# Patient Record
Sex: Male | Born: 1955
Health system: Southern US, Community
[De-identification: ages and names within clinical notes are randomized; demographics above are authoritative.]

## PROBLEM LIST (undated history)

## (undated) DIAGNOSIS — G2581 Restless legs syndrome: Secondary | ICD-10-CM

## (undated) DIAGNOSIS — I10 Essential (primary) hypertension: Secondary | ICD-10-CM

## (undated) DIAGNOSIS — G4730 Sleep apnea, unspecified: Secondary | ICD-10-CM

## (undated) DIAGNOSIS — F419 Anxiety disorder, unspecified: Secondary | ICD-10-CM

## (undated) DIAGNOSIS — K219 Gastro-esophageal reflux disease without esophagitis: Secondary | ICD-10-CM

## (undated) DIAGNOSIS — K5792 Diverticulitis of intestine, part unspecified, without perforation or abscess without bleeding: Secondary | ICD-10-CM

## (undated) DIAGNOSIS — K648 Other hemorrhoids: Secondary | ICD-10-CM

## (undated) DIAGNOSIS — R0683 Snoring: Secondary | ICD-10-CM

## (undated) HISTORY — DX: Other hemorrhoids: K64.8

## (undated) HISTORY — DX: Gastro-esophageal reflux disease without esophagitis: K21.9

## (undated) HISTORY — DX: Diverticulitis of intestine, part unspecified, without perforation or abscess without bleeding: K57.92

---
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
## 1998-05-04 ENCOUNTER — Emergency Department (HOSPITAL_COMMUNITY): Admission: EM | Admit: 1998-05-04 | Discharge: 1998-05-04 | Payer: Self-pay

## 2001-02-13 ENCOUNTER — Encounter: Admission: RE | Admit: 2001-02-13 | Discharge: 2001-03-18 | Payer: Self-pay | Admitting: Orthopedic Surgery

## 2002-06-03 ENCOUNTER — Inpatient Hospital Stay (HOSPITAL_COMMUNITY): Admission: EM | Admit: 2002-06-03 | Discharge: 2002-06-06 | Payer: Self-pay | Admitting: Psychiatry

## 2002-10-13 ENCOUNTER — Encounter: Payer: Self-pay | Admitting: General Surgery

## 2002-10-15 ENCOUNTER — Ambulatory Visit (HOSPITAL_COMMUNITY): Admission: RE | Admit: 2002-10-15 | Discharge: 2002-10-15 | Payer: Self-pay | Admitting: General Surgery

## 2002-10-19 ENCOUNTER — Encounter (INDEPENDENT_AMBULATORY_CARE_PROVIDER_SITE_OTHER): Payer: Self-pay

## 2002-10-19 ENCOUNTER — Ambulatory Visit (HOSPITAL_COMMUNITY): Admission: RE | Admit: 2002-10-19 | Discharge: 2002-10-19 | Payer: Self-pay | Admitting: General Surgery

## 2005-07-25 ENCOUNTER — Emergency Department (HOSPITAL_COMMUNITY): Admission: EM | Admit: 2005-07-25 | Discharge: 2005-07-25 | Payer: Self-pay | Admitting: Emergency Medicine

## 2008-02-22 ENCOUNTER — Ambulatory Visit: Payer: Self-pay | Admitting: Family Medicine

## 2008-02-22 DIAGNOSIS — Z860100 Personal history of colon polyps, unspecified: Secondary | ICD-10-CM | POA: Insufficient documentation

## 2008-02-22 DIAGNOSIS — K219 Gastro-esophageal reflux disease without esophagitis: Secondary | ICD-10-CM

## 2008-02-23 ENCOUNTER — Ambulatory Visit: Payer: Self-pay | Admitting: Family Medicine

## 2008-02-23 LAB — CONVERTED CEMR LAB
Glucose, Urine, Semiquant: NEGATIVE
Nitrite: NEGATIVE
Protein, U semiquant: NEGATIVE
Specific Gravity, Urine: 1.025
Urobilinogen, UA: 0.2
WBC Urine, dipstick: NEGATIVE
pH: 5

## 2008-02-24 ENCOUNTER — Ambulatory Visit (HOSPITAL_COMMUNITY): Admission: RE | Admit: 2008-02-24 | Discharge: 2008-02-24 | Payer: Self-pay | Admitting: Family Medicine

## 2008-02-24 LAB — CONVERTED CEMR LAB
ALT: 33 units/L (ref 0–53)
AST: 28 units/L (ref 0–37)
Albumin: 3.8 g/dL (ref 3.5–5.2)
Alkaline Phosphatase: 62 units/L (ref 39–117)
BUN: 20 mg/dL (ref 6–23)
Basophils Absolute: 0 10*3/uL (ref 0.0–0.1)
Basophils Relative: 0.7 % (ref 0.0–1.0)
Bilirubin, Direct: 0.1 mg/dL (ref 0.0–0.3)
CO2: 28 meq/L (ref 19–32)
Calcium: 9.5 mg/dL (ref 8.4–10.5)
Chloride: 102 meq/L (ref 96–112)
Cholesterol: 254 mg/dL (ref 0–200)
Creatinine, Ser: 1.1 mg/dL (ref 0.4–1.5)
Direct LDL: 174.1 mg/dL
Eosinophils Absolute: 0.3 10*3/uL (ref 0.0–0.7)
Eosinophils Relative: 4.9 % (ref 0.0–5.0)
GFR calc Af Amer: 91 mL/min
GFR calc non Af Amer: 75 mL/min
Glucose, Bld: 99 mg/dL (ref 70–99)
HCT: 44.8 % (ref 39.0–52.0)
HDL: 44.2 mg/dL (ref >39.0)
Hemoglobin: 15.7 g/dL (ref 13.0–17.0)
Lymphocytes Relative: 32.3 % (ref 12.0–46.0)
MCHC: 35 g/dL (ref 30.0–36.0)
MCV: 92 fL (ref 78.0–100.0)
Monocytes Absolute: 0.5 10*3/uL (ref 0.1–1.0)
Monocytes Relative: 7.9 % (ref 3.0–12.0)
Neutro Abs: 3.7 10*3/uL (ref 1.4–7.7)
Neutrophils Relative %: 54.2 % (ref 43.0–77.0)
PSA: 0.67 ng/mL (ref 0.10–4.00)
Platelets: 244 10*3/uL (ref 150–400)
Potassium: 5.4 meq/L — ABNORMAL HIGH (ref 3.5–5.1)
RBC: 4.86 M/uL (ref 4.22–5.81)
RDW: 11.8 % (ref 11.5–14.6)
Sodium: 138 meq/L (ref 135–145)
TSH: 2.74 microintl units/mL (ref 0.35–5.50)
Total Bilirubin: 0.8 mg/dL (ref 0.3–1.2)
Total CHOL/HDL Ratio: 5.7
Total Protein: 7.3 g/dL (ref 6.0–8.3)
Triglycerides: 201 mg/dL (ref 0–149)
VLDL: 40 mg/dL (ref 0–40)
WBC: 6.6 10*3/uL (ref 4.5–10.5)

## 2008-03-14 ENCOUNTER — Ambulatory Visit: Payer: Self-pay | Admitting: Gastroenterology

## 2008-03-14 DIAGNOSIS — R1310 Dysphagia, unspecified: Secondary | ICD-10-CM | POA: Insufficient documentation

## 2008-03-14 DIAGNOSIS — K625 Hemorrhage of anus and rectum: Secondary | ICD-10-CM

## 2008-03-22 ENCOUNTER — Encounter: Payer: Self-pay | Admitting: Family Medicine

## 2008-03-22 ENCOUNTER — Encounter: Payer: Self-pay | Admitting: Gastroenterology

## 2008-03-22 ENCOUNTER — Ambulatory Visit: Payer: Self-pay | Admitting: Gastroenterology

## 2008-03-22 ENCOUNTER — Ambulatory Visit (HOSPITAL_COMMUNITY): Admission: RE | Admit: 2008-03-22 | Discharge: 2008-03-22 | Payer: Self-pay | Admitting: Gastroenterology

## 2008-03-23 ENCOUNTER — Encounter: Payer: Self-pay | Admitting: Gastroenterology

## 2008-03-23 ENCOUNTER — Telehealth: Payer: Self-pay | Admitting: Gastroenterology

## 2008-03-23 HISTORY — PX: COLONOSCOPY: SHX174

## 2008-03-24 ENCOUNTER — Encounter: Payer: Self-pay | Admitting: Gastroenterology

## 2008-03-28 ENCOUNTER — Ambulatory Visit: Payer: Self-pay | Admitting: Gastroenterology

## 2008-04-18 ENCOUNTER — Telehealth: Payer: Self-pay | Admitting: Gastroenterology

## 2008-04-21 ENCOUNTER — Telehealth: Payer: Self-pay | Admitting: Gastroenterology

## 2008-04-21 ENCOUNTER — Ambulatory Visit: Payer: Self-pay | Admitting: Gastroenterology

## 2008-04-21 HISTORY — PX: ESOPHAGEAL DILATION: SHX303

## 2008-04-26 ENCOUNTER — Encounter: Payer: Self-pay | Admitting: Gastroenterology

## 2008-04-26 DIAGNOSIS — K649 Unspecified hemorrhoids: Secondary | ICD-10-CM | POA: Insufficient documentation

## 2008-05-18 ENCOUNTER — Ambulatory Visit: Payer: Self-pay | Admitting: Gastroenterology

## 2008-05-18 ENCOUNTER — Ambulatory Visit (HOSPITAL_COMMUNITY): Admission: RE | Admit: 2008-05-18 | Discharge: 2008-05-18 | Payer: Self-pay | Admitting: Gastroenterology

## 2008-05-18 ENCOUNTER — Encounter: Payer: Self-pay | Admitting: Family Medicine

## 2008-05-20 ENCOUNTER — Telehealth: Payer: Self-pay | Admitting: Gastroenterology

## 2008-05-23 ENCOUNTER — Telehealth: Payer: Self-pay | Admitting: Gastroenterology

## 2008-05-25 ENCOUNTER — Encounter: Payer: Self-pay | Admitting: Gastroenterology

## 2008-05-25 HISTORY — PX: OTHER SURGICAL HISTORY: SHX169

## 2008-06-29 ENCOUNTER — Ambulatory Visit: Payer: Self-pay | Admitting: Family Medicine

## 2008-06-29 DIAGNOSIS — M79609 Pain in unspecified limb: Secondary | ICD-10-CM

## 2008-06-30 ENCOUNTER — Telehealth: Payer: Self-pay | Admitting: Family Medicine

## 2008-07-07 ENCOUNTER — Telehealth: Payer: Self-pay | Admitting: Family Medicine

## 2008-07-08 ENCOUNTER — Telehealth: Payer: Self-pay | Admitting: Family Medicine

## 2008-08-02 ENCOUNTER — Ambulatory Visit: Payer: Self-pay | Admitting: Family Medicine

## 2008-08-02 DIAGNOSIS — R1031 Right lower quadrant pain: Secondary | ICD-10-CM | POA: Insufficient documentation

## 2008-08-02 LAB — CONVERTED CEMR LAB
Nitrite: NEGATIVE
Specific Gravity, Urine: >=1.03

## 2008-08-03 ENCOUNTER — Ambulatory Visit: Payer: Self-pay | Admitting: Cardiology

## 2008-08-03 LAB — CONVERTED CEMR LAB
Albumin: 3.9 g/dL (ref 3.5–5.2)
BUN: 18 mg/dL (ref 6–23)
Calcium: 9.3 mg/dL (ref 8.4–10.5)
Eosinophils Absolute: 0.4 10*3/uL (ref 0.0–0.7)
Eosinophils Relative: 3.9 % (ref 0.0–5.0)
GFR calc Af Amer: 114 mL/min
Glucose, Bld: 81 mg/dL (ref 70–99)
HCT: 44.9 % (ref 39.0–52.0)
Hemoglobin: 15.5 g/dL (ref 13.0–17.0)
MCV: 93 fL (ref 78.0–100.0)
Monocytes Absolute: 0.4 10*3/uL (ref 0.1–1.0)
Monocytes Relative: 4.4 % (ref 3.0–12.0)
Neutro Abs: 6 10*3/uL (ref 1.4–7.7)
RBC: 4.83 M/uL (ref 4.22–5.81)
RDW: 12.7 % (ref 11.5–14.6)
Total Protein: 7.3 g/dL (ref 6.0–8.3)

## 2009-10-17 ENCOUNTER — Ambulatory Visit: Payer: Self-pay | Admitting: Family Medicine

## 2009-10-17 DIAGNOSIS — J019 Acute sinusitis, unspecified: Secondary | ICD-10-CM

## 2009-11-28 ENCOUNTER — Ambulatory Visit: Payer: Self-pay | Admitting: Family Medicine

## 2010-04-12 ENCOUNTER — Telehealth: Payer: Self-pay | Admitting: Family Medicine

## 2010-08-28 ENCOUNTER — Telehealth: Payer: Self-pay | Admitting: Family Medicine

## 2010-08-29 ENCOUNTER — Ambulatory Visit
Admission: RE | Admit: 2010-08-29 | Discharge: 2010-08-29 | Payer: Self-pay | Source: Home / Self Care | Attending: Family Medicine | Admitting: Family Medicine

## 2010-09-16 LAB — CONVERTED CEMR LAB
AST: 28 units/L (ref 0–37)
BUN: 10 mg/dL (ref 6–23)
Basophils Absolute: 0 10*3/uL (ref 0.0–0.1)
Calcium: 9.2 mg/dL (ref 8.4–10.5)
Cholesterol: 244 mg/dL — ABNORMAL HIGH (ref 0–200)
Creatinine, Ser: 0.9 mg/dL (ref 0.4–1.5)
GFR calc non Af Amer: 93.71 mL/min (ref >60)
Glucose, Bld: 79 mg/dL (ref 70–99)
HCT: 44.2 % (ref 39.0–52.0)
HDL: 51.7 mg/dL (ref >39.00)
Lymphs Abs: 2.8 10*3/uL (ref 0.7–4.0)
Monocytes Relative: 7 % (ref 3.0–12.0)
PSA: 2.28 ng/mL (ref 0.10–4.00)
Platelets: 265 10*3/uL (ref 150.0–400.0)
RDW: 13.5 % (ref 11.5–14.6)
TSH: 3.32 microintl units/mL (ref 0.35–5.50)
Total Bilirubin: 0.3 mg/dL (ref 0.3–1.2)
Triglycerides: 221 mg/dL — ABNORMAL HIGH (ref 0.0–149.0)

## 2010-09-18 NOTE — Progress Notes (Signed)
Summary: refill cough syrup  Phone Note Refill Request Message from:  Fax from Pharmacy on April 12, 2010 8:23 AM  Refills Requested: Medication #1:  hydrocodone-chlorpheniram susp   Last Refilled: 12/29/2009   Notes: 5ml bid prn cough   Method Requested: Fax to Local Pharmacy [facility]er: [facility] [phone]*  Follow-up for Phone Call        No, he needs an OV for this Follow-up by: Nelwyn Salisbury MD,  April 12, 2010 9:54 AM  Additional Follow-up for Phone Call Additional follow up Details #1::        Rx denial called/faxed to pharmacy Additional Follow-up by: Raechel Ache, RN,  April 12, 2010 10:16 AM
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
## 2010-09-18 NOTE — Assessment & Plan Note (Signed)
Summary: sinuses//ccm   Vital Signs:  Patient profile:   55 year old male Weight:      229 pounds BMI:     32.51 Temp:     98.6 degrees F oral Pulse rate:   104 / minute BP sitting:   112 / 74  (left arm) Cuff size:   large  Vitals Entered By: Alfred Levins, CMA (October 17, 2009 10:02 AM) CC: laryngitis, cough, chest congestion x3 wks   History of Present Illness: Here for 3 weeks of stuffy head, HA, ST, hoarseness, and a dry cough. No fever.   Current Medications (verified): 1)  Aspirin 81 Mg  Tbec (Aspirin) .... Take 1 Tablet By Mouth Once A Day 2)  Nexium 40 Mg  Cpdr (Esomeprazole Magnesium) .... Take 1 Tablet By Mouth Once A Day 3)  Vicodin 5-500 Mg Tabs (Hydrocodone-Acetaminophen) .... One By Mouth Q 6 Hours As Needed Pain  Allergies (verified): No Known Drug Allergies  Past History:  Past Medical History: Reviewed history from 06/29/2008 and no changes required. Colonic polyps, hx of GERD rectal bleeding internal hemorrhoids, sees Dr. Arlyce Dice  Review of Systems  The patient denies anorexia, fever, weight loss, weight gain, vision loss, decreased hearing, hoarseness, chest pain, syncope, dyspnea on exertion, peripheral edema, hemoptysis, abdominal pain, melena, hematochezia, severe indigestion/heartburn, hematuria, incontinence, genital sores, muscle weakness, suspicious skin lesions, transient blindness, difficulty walking, depression, unusual weight change, abnormal bleeding, enlarged lymph nodes, angioedema, breast masses, and testicular masses.    Physical Exam  General:  Well-developed,well-nourished,in no acute distress; alert,appropriate and cooperative throughout examination Head:  Normocephalic and atraumatic without obvious abnormalities. No apparent alopecia or balding. Eyes:  No corneal or conjunctival inflammation noted. EOMI. Perrla. Funduscopic exam benign, without hemorrhages, exudates or papilledema. Vision grossly normal. Ears:  External ear exam  shows no significant lesions or deformities.  Otoscopic examination reveals clear canals, tympanic membranes are intact bilaterally without bulging, retraction, inflammation or discharge. Hearing is grossly normal bilaterally. Nose:  External nasal examination shows no deformity or inflammation. Nasal mucosa are pink and moist without lesions or exudates. Mouth:  Oral mucosa and oropharynx without lesions or exudates.  Teeth in good repair. Neck:  No deformities, masses, or tenderness noted. Lungs:  Normal respiratory effort, chest expands symmetrically. Lungs are clear to auscultation, no crackles or wheezes.   Impression & Recommendations:  Problem # 1:  ACUTE SINUSITIS, UNSPECIFIED (ICD-461.9)  His updated medication list for this problem includes:    Augmentin 875-125 Mg Tabs (Amoxicillin-pot clavulanate) .Marland Kitchen..Marland Kitchen Two times a day    Tussionex Pennkinetic Er 8-10 Mg/34ml Lqcr (Chlorpheniramine-hydrocodone) .Marland Kitchen... 1 tsp two times a day as needed cough  Complete Medication List: 1)  Aspirin 81 Mg Tbec (Aspirin) .... Take 1 tablet by mouth once a day 2)  Nexium 40 Mg Cpdr (Esomeprazole magnesium) .... Take 1 tablet by mouth once a day 3)  Vicodin 5-500 Mg Tabs (Hydrocodone-acetaminophen) .... One by mouth q 6 hours as needed pain 4)  Augmentin 875-125 Mg Tabs (Amoxicillin-pot clavulanate) .... Two times a day 5)  Tussionex Pennkinetic Er 8-10 Mg/86ml Lqcr (Chlorpheniramine-hydrocodone) .Marland Kitchen.. 1 tsp two times a day as needed cough  Patient Instructions: 1)  Please schedule a follow-up appointment as needed .  Prescriptions: Sandria Senter ER 8-10 MG/5ML LQCR (CHLORPHENIRAMINE-HYDROCODONE) 1 tsp two times a day as needed cough  #240 x 0   Entered and Authorized by:   Nelwyn Salisbury MD   Signed by:   Nelwyn Salisbury MD  on 10/17/2009   Method used:   Print then Give to Patient   RxID:   [id number] AUGMENTIN 875-125 MG TABS (AMOXICILLIN-POT CLAVULANATE) two times a day  #20 x 0   Entered  and Authorized by:   Nelwyn Salisbury MD   Signed by:   Nelwyn Salisbury MD on 10/17/2009   Method used:   Print then Give to Patient   RxID:   [id number]

## 2010-09-20 NOTE — Progress Notes (Signed)
  Phone Note Call from Patient   Caller: Patient Call For: Nelwyn Salisbury MD Summary of Call: Chills, URI, cough, (dry), sore throat, and wants an antibiotic, and cough meds.  Works in [employer], and says he has to do something today? [phone] [facility]) Initial call taken by: Lynann Beaver CMA AAMA,  August 28, 2010 8:24 AM  Follow-up for Phone Call        needs an OV  Follow-up by: Nelwyn Salisbury MD,  August 28, 2010 1:53 PM  Additional Follow-up for Phone Call Additional follow up Details #1::        Appt scheduled. Additional Follow-up by: Lynann Beaver CMA AAMA,  August 28, 2010 2:40 PM

## 2010-09-20 NOTE — Assessment & Plan Note (Signed)
Summary: viral illness/dm   Vital Signs:  Patient profile:   55 year old male Height:      70.5 inches Weight:      221 pounds BMI:     31.38 Temp:     98.3 degrees F oral BP sitting:   140 / 98  (left arm) Cuff size:   regular  Vitals Entered By: Kern Reap CMA Duncan Dull) (August 29, 2010 10:06 AM) CC: cold and congestion   History of Present Illness: Here for one week of sinus pressure, PND, ST, and a dry cough. No fever.   Allergies (verified): No Known Drug Allergies  Past History:  Past Medical History: Reviewed history from 11/28/2009 and no changes required. Colonic polyps, hx of GERD rectal bleeding internal hemorrhoids, sees Dr. Arlyce Dice diverticulitis  Review of Systems  The patient denies anorexia, fever, weight loss, weight gain, vision loss, decreased hearing, hoarseness, chest pain, syncope, dyspnea on exertion, peripheral edema, hemoptysis, abdominal pain, melena, hematochezia, severe indigestion/heartburn, hematuria, incontinence, genital sores, muscle weakness, suspicious skin lesions, transient blindness, difficulty walking, depression, unusual weight change, abnormal bleeding, enlarged lymph nodes, angioedema, breast masses, and testicular masses.    Physical Exam  General:  Well-developed,well-nourished,in no acute distress; alert,appropriate and cooperative throughout examination Head:  Normocephalic and atraumatic without obvious abnormalities. No apparent alopecia or balding. Eyes:  No corneal or conjunctival inflammation noted. EOMI. Perrla. Funduscopic exam benign, without hemorrhages, exudates or papilledema. Vision grossly normal. Ears:  External ear exam shows no significant lesions or deformities.  Otoscopic examination reveals clear canals, tympanic membranes are intact bilaterally without bulging, retraction, inflammation or discharge. Hearing is grossly normal bilaterally. Nose:  External nasal examination shows no deformity or inflammation.  Nasal mucosa are pink and moist without lesions or exudates. Mouth:  Oral mucosa and oropharynx without lesions or exudates.  Teeth in good repair. Neck:  No deformities, masses, or tenderness noted. Lungs:  Normal respiratory effort, chest expands symmetrically. Lungs are clear to auscultation, no crackles or wheezes.   Impression & Recommendations:  Problem # 1:  ACUTE SINUSITIS, UNSPECIFIED (ICD-461.9)  His updated medication list for this problem includes:    Augmentin 875-125 Mg Tabs (Amoxicillin-pot clavulanate) .Marland Kitchen..Marland Kitchen Two times a day    Hydromet 5-1.5 Mg/64ml Syrp (Hydrocodone-homatropine) .Marland Kitchen... 1 tsp q 4 hours as needed cough  Complete Medication List: 1)  Nexium 40 Mg Cpdr (Esomeprazole magnesium) .Marland Kitchen.. 1 once daily 2)  Augmentin 875-125 Mg Tabs (Amoxicillin-pot clavulanate) .... Two times a day 3)  Hydromet 5-1.5 Mg/42ml Syrp (Hydrocodone-homatropine) .Marland Kitchen.. 1 tsp q 4 hours as needed cough  Patient Instructions: 1)  Please schedule a follow-up appointment as needed .  Prescriptions: HYDROMET 5-1.5 MG/5ML SYRP (HYDROCODONE-HOMATROPINE) 1 tsp q 4 hours as needed cough  #240 x 0   Entered and Authorized by:   Nelwyn Salisbury MD   Signed by:   Nelwyn Salisbury MD on 08/29/2010   Method used:   Print then Give to Patient   RxID:   [id number] AUGMENTIN 875-125 MG TABS (AMOXICILLIN-POT CLAVULANATE) two times a day  #28 x 0   Entered and Authorized by:   Nelwyn Salisbury MD   Signed by:   Nelwyn Salisbury MD on 08/29/2010   Method used:   Print then Give to Patient   RxID:   [id number]    Orders Added: 1)  Est. Patient Level IV [95284]

## 2010-09-24 IMAGING — CR DG FOOT COMPLETE 3+V*R*
3 series · 3 of 3 positions shown · non-contrast
Comparison: None

CLINICAL DATA: Right anterior foot pain, chamber fell while walking
in sleep 2 weeks ago

RIGHT FOOT COMPLETE - 3+ VIEW

[view not recorded (1 of 3)]
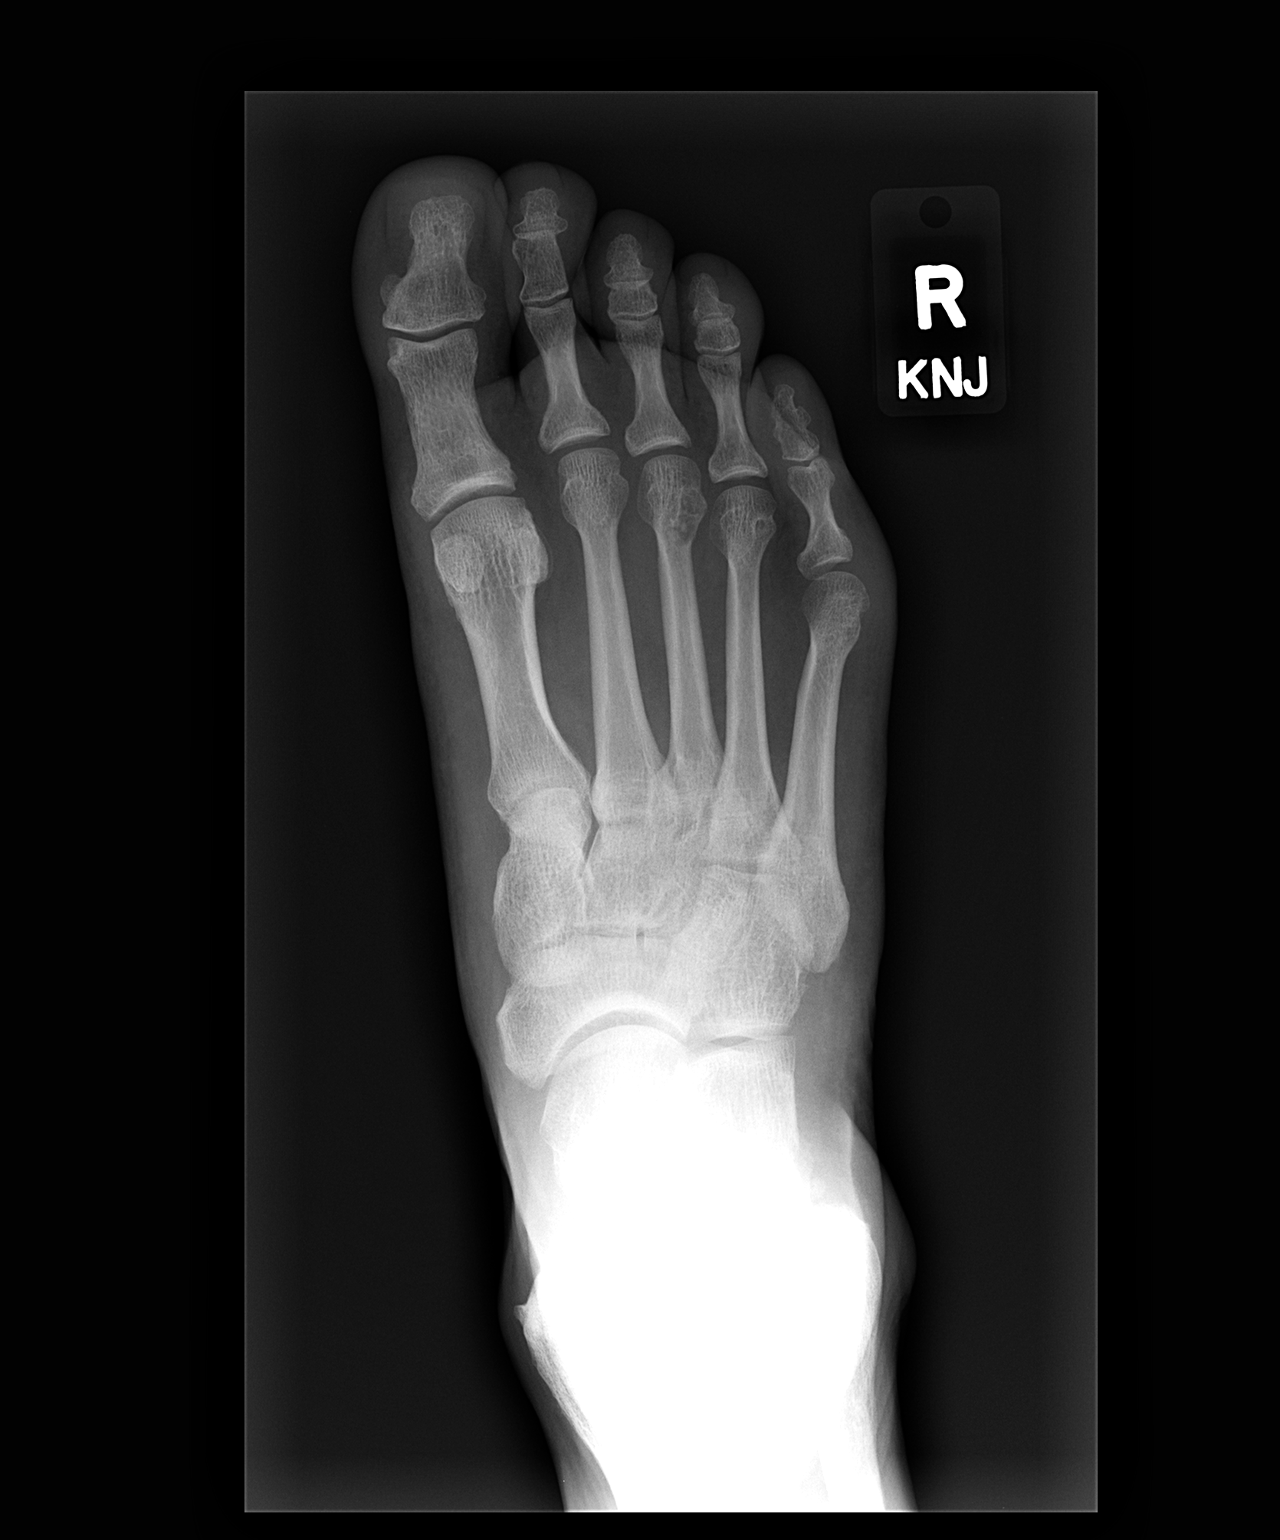

[view not recorded (2 of 3)]
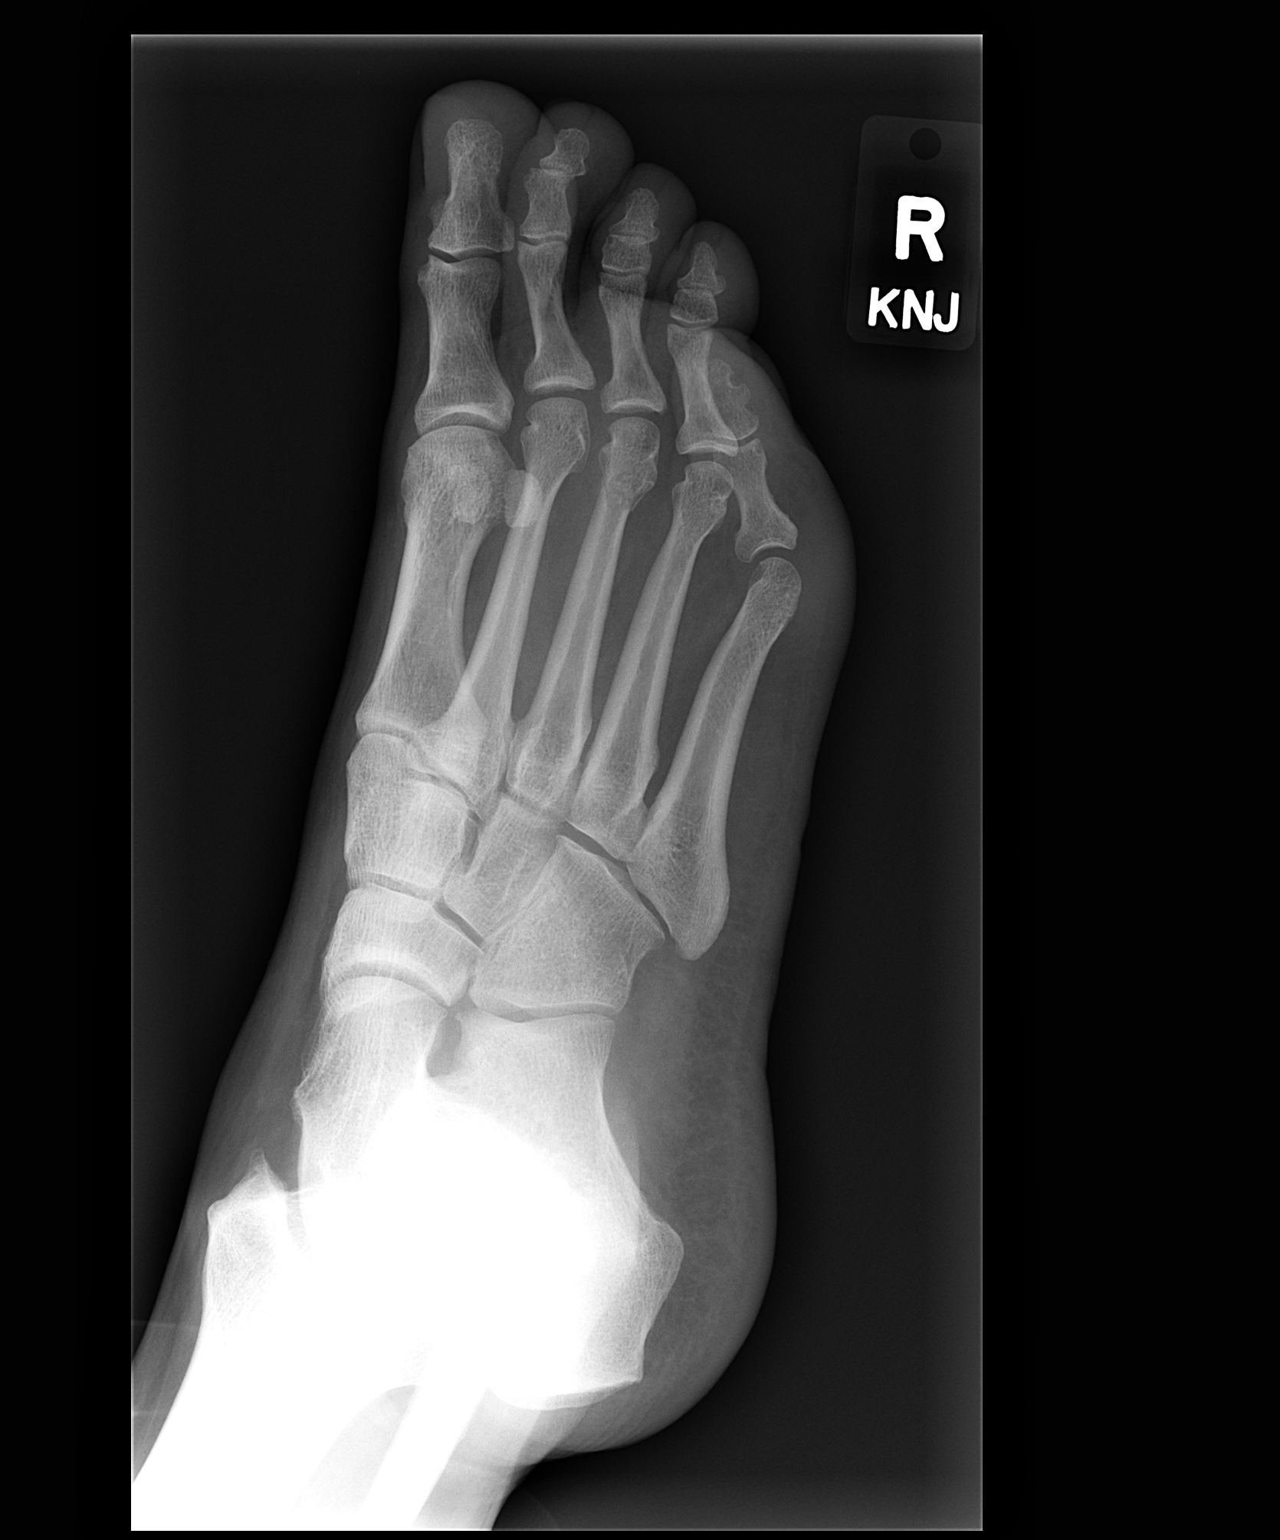

[view not recorded (3 of 3)]
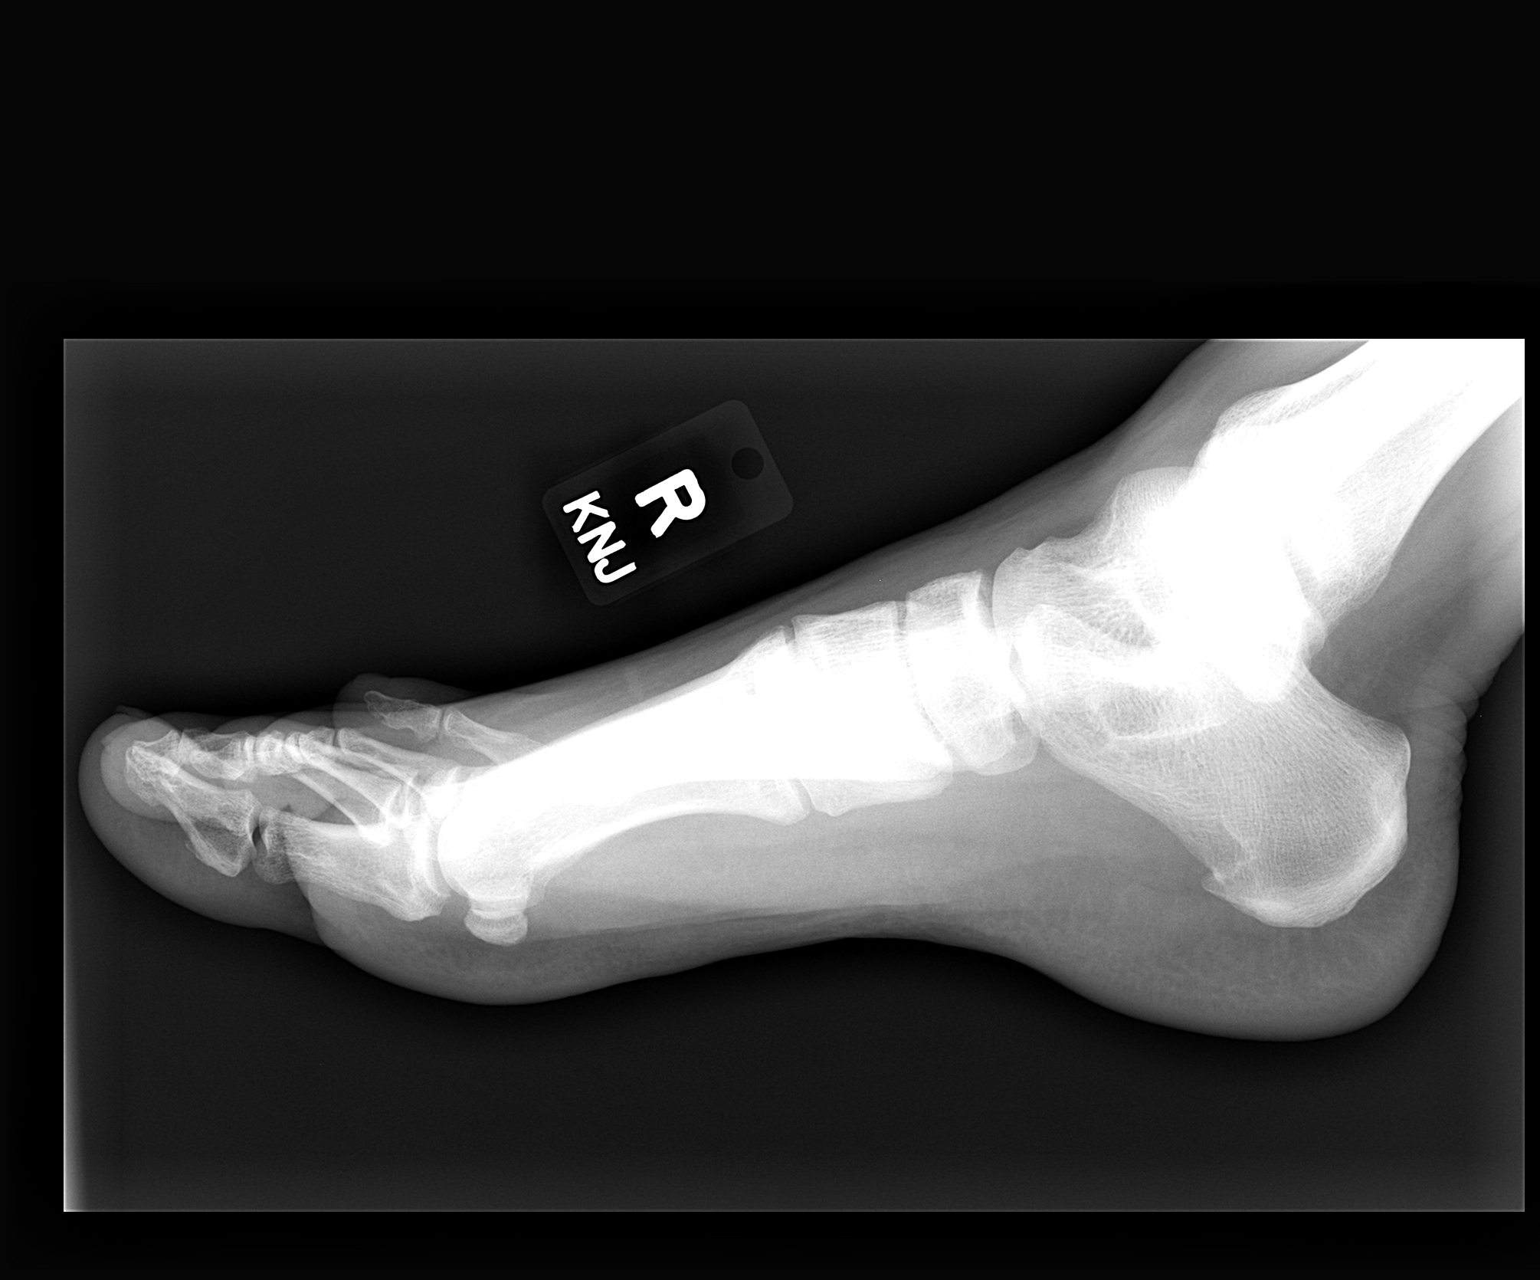

[3 of 3 positions shown; findings below may reference images not displayed]

FINDINGS: No acute fracture is seen.  Alignment is normal.  Some
lucency is noted in the neck of the right third metatarsal which
appears well corticated and consistent with a benign process.
IMPRESSION: No acute bony abnormality.

## 2010-10-18 ENCOUNTER — Other Ambulatory Visit: Payer: Self-pay | Admitting: Family Medicine

## 2010-10-25 ENCOUNTER — Telehealth: Payer: Self-pay | Admitting: *Deleted

## 2010-10-25 NOTE — Telephone Encounter (Signed)
patient  Is requesting a refill of hydrocodone cough syrup.  [facility]

## 2010-10-26 NOTE — Telephone Encounter (Signed)
rx called in

## 2010-10-26 NOTE — Telephone Encounter (Signed)
Call in Hydromet, take one tsp q 4 hours prn cough, 240 ml with no rf

## 2010-11-01 ENCOUNTER — Other Ambulatory Visit: Payer: Self-pay | Admitting: *Deleted

## 2010-11-01 NOTE — Telephone Encounter (Signed)
Patient is requesting a refill of hydrocodone cough syrup is this okay to fill?

## 2010-11-06 ENCOUNTER — Encounter: Payer: Self-pay | Admitting: Family Medicine

## 2010-11-08 MED ORDER — HYDROCODONE-HOMATROPINE 5-1.5 MG/5ML PO SYRP: 5.0000 mL | ORAL_SOLUTION | ORAL | Status: AC | PRN

## 2010-11-08 NOTE — Telephone Encounter (Signed)
Called into [facility] 

## 2010-11-08 NOTE — Telephone Encounter (Signed)
Okay to fill another 240 ml bottle

## 2010-11-30 ENCOUNTER — Encounter: Payer: Self-pay | Admitting: Family Medicine

## 2010-12-11 ENCOUNTER — Encounter: Payer: Self-pay | Admitting: Family Medicine

## 2010-12-12 ENCOUNTER — Ambulatory Visit (INDEPENDENT_AMBULATORY_CARE_PROVIDER_SITE_OTHER): Payer: PRIVATE HEALTH INSURANCE | Admitting: Family Medicine

## 2010-12-12 ENCOUNTER — Encounter: Payer: Self-pay | Admitting: Family Medicine

## 2010-12-12 VITALS — BP 112/84 | HR 92 | Temp 98.0°F | Ht 70.5 in | Wt 231.0 lb

## 2010-12-12 DIAGNOSIS — Z0000 Encounter for general adult medical examination without abnormal findings: Secondary | ICD-10-CM

## 2010-12-12 DIAGNOSIS — Z136 Encounter for screening for cardiovascular disorders: Secondary | ICD-10-CM

## 2010-12-12 DIAGNOSIS — Z13220 Encounter for screening for lipoid disorders: Secondary | ICD-10-CM

## 2010-12-12 LAB — POCT URINALYSIS DIPSTICK
Blood, UA: NEGATIVE
Glucose, UA: NEGATIVE
Spec Grav, UA: 1.02
Urobilinogen, UA: 0.2
pH, UA: 5.5

## 2010-12-12 LAB — LDL CHOLESTEROL, DIRECT: Direct LDL: 174.7 mg/dL

## 2010-12-12 LAB — CBC WITH DIFFERENTIAL/PLATELET
Basophils Relative: 0.4 % (ref 0.0–3.0)
Eosinophils Absolute: 0.3 10*3/uL (ref 0.0–0.7)
Eosinophils Relative: 5 % (ref 0.0–5.0)
HCT: 46.2 % (ref 39.0–52.0)
Lymphs Abs: 2.9 10*3/uL (ref 0.7–4.0)
MCHC: 34.4 g/dL (ref 30.0–36.0)
MCV: 93.5 fl (ref 78.0–100.0)
Monocytes Absolute: 0.5 10*3/uL (ref 0.1–1.0)
Platelets: 241 10*3/uL (ref 150.0–400.0)
WBC: 6.6 10*3/uL (ref 4.5–10.5)

## 2010-12-12 LAB — LIPID PANEL
Cholesterol: 255 mg/dL — ABNORMAL HIGH (ref 0–200)
HDL: 50 mg/dL (ref >39.00)
Triglycerides: 184 mg/dL — ABNORMAL HIGH (ref 0.0–149.0)

## 2010-12-12 LAB — BASIC METABOLIC PANEL
BUN: 18 mg/dL (ref 6–23)
CO2: 30 mEq/L (ref 19–32)
Calcium: 9.7 mg/dL (ref 8.4–10.5)
Creatinine, Ser: 0.9 mg/dL (ref 0.4–1.5)
GFR: 98.38 mL/min (ref >60.00)
Glucose, Bld: 90 mg/dL (ref 70–99)

## 2010-12-12 LAB — HEPATIC FUNCTION PANEL
ALT: 27 U/L (ref 0–53)
AST: 25 U/L (ref 0–37)
Alkaline Phosphatase: 60 U/L (ref 39–117)
Bilirubin, Direct: 0 mg/dL (ref 0.0–0.3)
Total Protein: 7.5 g/dL (ref 6.0–8.3)

## 2010-12-12 MED ORDER — SILDENAFIL CITRATE 100 MG PO TABS: 100.0000 mg | ORAL_TABLET | ORAL | Status: DC | PRN

## 2010-12-12 MED ORDER — ALPRAZOLAM 1 MG PO TABS: 1.0000 mg | ORAL_TABLET | Freq: Three times a day (TID) | ORAL | Status: DC | PRN

## 2010-12-12 NOTE — Progress Notes (Signed)
  Subjective:    Patient ID: Dwayne Woodard, male    DOB: 1955-11-05, 55 y.o.   MRN: [id number]  HPI 55 yr old male for a cpx. He has several issues to discuss. First he asks for something to help him relax. 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 He can't relax at home and has trouble sleeping. This has resulted in him drinking a lot more beer every day than he used to. He has gained weight and feels tired al the time. He has had some erection problems. No GERD symptoms at all.    Review of Systems  Constitutional: Negative.   HENT: Negative.   Eyes: Negative.   Respiratory: Negative.   Cardiovascular: Negative.   Gastrointestinal: Negative.   Genitourinary: Negative.   Musculoskeletal: Negative.   Skin: Negative.   Neurological: Negative.   Hematological: Negative.   Psychiatric/Behavioral: Positive for sleep disturbance and decreased concentration. Negative for suicidal ideas, hallucinations, behavioral problems, confusion, self-injury, dysphoric mood and agitation. The patient is nervous/anxious. The patient is not hyperactive.        Objective:   Physical Exam  Constitutional: He is oriented to person, place, and time. He appears well-developed and well-nourished. No distress.  HENT:  Head: Normocephalic and atraumatic.  Right Ear: External ear normal.  Left Ear: External ear normal.  Nose: Nose normal.  Mouth/Throat: Oropharynx is clear and moist. No oropharyngeal exudate.  Eyes: Conjunctivae and EOM are normal. Pupils are equal, round, and reactive to light. Right eye exhibits no discharge. Left eye exhibits no discharge. No scleral icterus.  Neck: Neck supple. No JVD present. No tracheal deviation present. No thyromegaly present.  Cardiovascular: Normal rate, regular rhythm, normal heart sounds and intact distal pulses.  Exam reveals no gallop and no friction rub.   No murmur heard.      EKG normal     Pulmonary/Chest: Effort normal and breath sounds normal. No respiratory distress. He has no wheezes. He has no rales. He exhibits no tenderness.  Abdominal: Soft. Bowel sounds are normal. He exhibits no distension and no mass. There is no tenderness. There is no rebound and no guarding.  Genitourinary: Rectum normal, prostate normal and penis normal. Guaiac negative stool. No penile tenderness.  Musculoskeletal: Normal range of motion. He exhibits no edema and no tenderness.  Lymphadenopathy:    He has no cervical adenopathy.  Neurological: He is alert and oriented to person, place, and time. He has normal reflexes. No cranial nerve deficit. He exhibits normal muscle tone. Coordination normal.  Skin: Skin is warm and dry. No rash noted. He is not diaphoretic. No erythema. No pallor.  Psychiatric: He has a normal mood and affect. His behavior is normal. Judgment and thought content normal.          Assessment & Plan:  He needs to exercise and lose weight. He needs to quit smoking. He needs to decrease his alcohol use. Try Xanax prn . Get labs today. Try Viagra.

## 2010-12-13 NOTE — Progress Notes (Signed)
Informed wife-will let Pt know.
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
## 2011-01-04 NOTE — Op Note (Signed)
NAME:  Dwayne Woodard, Dwayne Woodard                        ACCOUNT NO.:  [id number]   MEDICAL RECORD NO.:  [id number]                   PATIENT TYPE:  AMB   LOCATION:  DAY                                  FACILITY:  [facility]   PHYSICIAN:  Timothy E. Earlene Plater, M.D.              DATE OF BIRTH:  26-May-1956   DATE OF PROCEDURE:  10/19/2002  DATE OF DISCHARGE:                                 OPERATIVE REPORT   PREOPERATIVE DIAGNOSIS:  Internal hemorrhoids with prolapse.   POSTOPERATIVE DIAGNOSIS:  1. Internal hemorrhoids with prolapse.  2. PPH anopexy.   SURGEON:  Timothy E. Earlene Plater, M.D.   ANESTHESIA:  General.   INDICATIONS:  The patient is 49 and considers himself otherwise healthy.  He  has frequent stools, often straining, and does work at a strenuous job.  He  has failed conservative management.  After careful discussion, he wishes to  proceed with a PPH anopexy.  This has been carefully explained as well as  options, potential complications and problems.   He was prepared at home, evaluated by anesthesia, identified and the permit  signed.   DESCRIPTION OF PROCEDURE:  He was taken to the operating room, placed supine  and LMA anesthesia provided.  He was placed in steep lithotomy position.  The perianal area was prepped and draped in the usual fashion.  The anus was  inspected.  Anoscopy was carried out and the intrarectal vault cleansed and  painted with Betadine.  The anus was injected round and about with 10 mL of  Marcaine, 0.25% with epinephrine mixed 9:1 with Wydase and an additional 10%  for a wide field block anesthesia.  The anus was then dilated and the  Ethicon introducer placed.  The rectal mucosa was identified and the  distance from the dentate line mark and then a pursestring suture of 2-0  Prolene was placed approximately 4.5 to 5 cm from the dentate line.  The  Ethicon [facility] stapler was then introduced wide open through the suture line  which was then tied about the head of  the staple device and then the staple  device was carefully closed and fired and removed.  A large ream of tissue  was in the staple device and was thought to be satisfactory.  The  intrarectal area was then carefully examined.  Three sutures of 3-0 Vicryl  were placed at the posterior, right posterior and anterior positions for  minor bleeding.  This area was inspected for a total of 10 minutes and no  bleeding occurred.  All instruments and gauzes were removed.  Gelfoam gauze  was placed at the anorectal canal and a dry sterile dressing.  He tolerated  it well.  The results are very satisfactory.  The suture line was measured  at 4 cm from the dentate line.   He was removed to the recovery room in good condition.    DISCHARGE INSTRUCTIONS:  Written and verbal instructions include 36 Vicodin  were given.  He will be followed as an outpatient.  were given and she will be seen and followed as an outpatient.                                               Timothy E. Earlene Plater, M.D.    TED/MEDQ  D:  10/19/2002  T:  10/19/2002  Job:  [id number]

## 2011-01-04 NOTE — Op Note (Signed)
NAME:  Dwayne Woodard, Dwayne Woodard NO.:  [id number]   MEDICAL RECORD NO.:  [id number]          PATIENT TYPE:  EMS   LOCATION:  MAJO                         FACILITY:  [facility]   PHYSICIAN:  Vanita Panda. Magnus Ivan, M.D.DATE OF BIRTH:  1955-11-13   DATE OF PROCEDURE:  07/25/2005  DATE OF DISCHARGE:  07/25/2005                                 OPERATIVE REPORT   PREPROCEDURE DIAGNOSIS:  Left thumb-tip amputation.   POSTPROCEDURE DIAGNOSIS:  Left thumb-tip amputation.   PROCEDURE:  Revision amputation of left thumb-tip with complex closure.   SURGEON:  Vanita Panda. Magnus Ivan, M.D.   ANESTHESIA:  1/4% plain Marcaine digital block.   BLOOD LOSS:  Minimal.   COMPLICATIONS:  None.   INDICATIONS:  Briefly, Mr. Mccleod is a 55 year old left-hand dominant male  who had his thumb caught in a steel door that slammed against the end of the  thumb, today.  He came into the ER with a thumb-tip amputation with a  complete loss of his nail and the end of his thumb, that he had on ice when  he came in.  There was a fracture through the distal phalanx.  On  examination there was only a small area of exposed bone and a large abundant  soft tissue.  The end of his thumb was intact in terms of the skin.  It was  recommended that he undergo revision of this thumb-tip in the ER; and he  agreed to proceed with this.   PROCEDURE DESCRIPTION:  After the left thumb was prepped with Betadine, a  digital block was obtained with 1/4% plain Sensorcaine. Once the block was  obtained, the thumb was assessed and the wound was cleaned further.  The  area was previously cleaned. It was cleaned further; and there was found to  be only minimal exposed bone. Using a 5-0 chromic suture, tissue was easily  closed over the bone with a deep layer of tissue. The nail itself was,  again, with the part of the thumb-tip that was on ice, and the nail matrix  was damaged. The nail was removed from the piece that was  put on ice and  then the skin from this area was prepared with removing all of the fat and  the tissue so this skin could to be used as a skin graft.   Next, I fashioned the skin over the tip of the thumb and secured it with  interrupted 4-0 Prolene suture as, again, a soft tissue dressing. The  patient tolerated procedure well without problems.  I then placed a Xeroform  over this followed by well padded sterile dressing. He will keep this  dressing on until his follow up in 3 to 4 days. I have told  him that the thumb-tip skin may not survive, but it would serve as a  biologic dressing until we can start dressing changes if this does not  survive; with the dressing changes and soaks we would be able to let this  granulate in if need be. Of note, he had full flexion-extension of his thumb  IP and MCP joint,  before-and-after the procedure.           ______________________________  Vanita Panda. Magnus Ivan, M.D.     CYB/MEDQ  D:  07/25/2005  T:  07/26/2005  Job:  [id number]
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
## 2011-07-05 ENCOUNTER — Other Ambulatory Visit: Payer: Self-pay | Admitting: Family Medicine

## 2011-07-05 MED ORDER — ALPRAZOLAM 1 MG PO TABS: 1.0000 mg | ORAL_TABLET | Freq: Three times a day (TID) | ORAL | Status: DC | PRN

## 2011-07-05 NOTE — Telephone Encounter (Signed)
rx called in to [facility] 

## 2011-07-05 NOTE — Telephone Encounter (Signed)
Pt need refill on alprazolam 1 mg call into [facility] [phone]. Pt is out.

## 2011-07-05 NOTE — Telephone Encounter (Signed)
Pt called again to check on status of refill. Pt stated that the pharmacy faxed over the 1st rx on 07-02-2011. He stated that he is out of meds today. Thanks.

## 2011-07-05 NOTE — Telephone Encounter (Signed)
Pharm call still waiting on refill request.

## 2011-07-05 NOTE — Telephone Encounter (Signed)
Call in #60 with 5 rf 

## 2011-07-17 ENCOUNTER — Other Ambulatory Visit: Payer: Self-pay | Admitting: Family Medicine

## 2011-07-18 NOTE — Telephone Encounter (Signed)
Spoke with pt and he will call back to make the appointment if he doesn't feel better.

## 2011-08-06 ENCOUNTER — Encounter: Payer: Self-pay | Admitting: Family Medicine

## 2011-08-06 ENCOUNTER — Ambulatory Visit (INDEPENDENT_AMBULATORY_CARE_PROVIDER_SITE_OTHER): Payer: PRIVATE HEALTH INSURANCE | Admitting: Family Medicine

## 2011-08-06 VITALS — BP 132/90 | HR 103 | Temp 98.6°F | Wt 235.0 lb

## 2011-08-06 DIAGNOSIS — J329 Chronic sinusitis, unspecified: Secondary | ICD-10-CM

## 2011-08-06 MED ORDER — LEVOFLOXACIN 500 MG PO TABS: 500.0000 mg | ORAL_TABLET | Freq: Every day | ORAL | Status: AC

## 2011-08-06 MED ORDER — HYDROCODONE-HOMATROPINE 5-1.5 MG/5ML PO SYRP: 5.0000 mL | ORAL_SOLUTION | ORAL | Status: AC | PRN

## 2011-08-06 MED ORDER — TADALAFIL 10 MG PO TABS: 10.0000 mg | ORAL_TABLET | Freq: Every day | ORAL | Status: AC | PRN

## 2011-08-06 NOTE — Progress Notes (Signed)
  Subjective:    Patient ID: Dwayne Woodard, male    DOB: 04/25/1956, 55 y.o.   MRN: [id number]  HPI Here for 2 weeks of sinus pressure, PND, ST, HA, and a dry cough. No fever.   Review of Systems  Constitutional: Negative.   HENT: Positive for congestion, postnasal drip and sinus pressure.   Eyes: Negative.   Respiratory: Positive for cough.        Objective:   Physical Exam  Constitutional: He appears well-nourished.  HENT:  Right Ear: External ear normal.  Left Ear: External ear normal.  Nose: Nose normal.  Mouth/Throat: Oropharynx is clear and moist. No oropharyngeal exudate.  Eyes: Conjunctivae are normal.  Pulmonary/Chest: Effort normal and breath sounds normal.  Lymphadenopathy:    He has no cervical adenopathy.          Assessment & Plan:  Add Mucinex prn

## 2012-01-29 ENCOUNTER — Ambulatory Visit (INDEPENDENT_AMBULATORY_CARE_PROVIDER_SITE_OTHER): Payer: PRIVATE HEALTH INSURANCE | Admitting: Family Medicine

## 2012-01-29 ENCOUNTER — Telehealth: Payer: Self-pay | Admitting: Family Medicine

## 2012-01-29 ENCOUNTER — Encounter: Payer: Self-pay | Admitting: Family Medicine

## 2012-01-29 VITALS — BP 140/90 | HR 96 | Temp 98.4°F | Wt 235.0 lb

## 2012-01-29 DIAGNOSIS — R209 Unspecified disturbances of skin sensation: Secondary | ICD-10-CM

## 2012-01-29 DIAGNOSIS — R42 Dizziness and giddiness: Secondary | ICD-10-CM

## 2012-01-29 DIAGNOSIS — R202 Paresthesia of skin: Secondary | ICD-10-CM

## 2012-01-29 DIAGNOSIS — G4730 Sleep apnea, unspecified: Secondary | ICD-10-CM

## 2012-01-29 LAB — BASIC METABOLIC PANEL
BUN: 14 mg/dL (ref 6–23)
Calcium: 9.3 mg/dL (ref 8.4–10.5)
GFR: 97.97 mL/min (ref >60.00)
Potassium: 4.3 mEq/L (ref 3.5–5.1)

## 2012-01-29 LAB — TSH: TSH: 2.43 u[IU]/mL (ref 0.35–5.50)

## 2012-01-29 LAB — POCT URINALYSIS DIPSTICK
Bilirubin, UA: NEGATIVE
Glucose, UA: NEGATIVE
Ketones, UA: NEGATIVE
Leukocytes, UA: NEGATIVE
Nitrite, UA: NEGATIVE

## 2012-01-29 LAB — HEPATIC FUNCTION PANEL
AST: 24 U/L (ref 0–37)
Total Bilirubin: 0.4 mg/dL (ref 0.3–1.2)

## 2012-01-29 LAB — CBC WITH DIFFERENTIAL/PLATELET
Basophils Relative: 0.4 % (ref 0.0–3.0)
Hemoglobin: 15.6 g/dL (ref 13.0–17.0)
Lymphocytes Relative: 34.5 % (ref 12.0–46.0)
Monocytes Relative: 6.2 % (ref 3.0–12.0)
Neutro Abs: 5.1 10*3/uL (ref 1.4–7.7)
Neutrophils Relative %: 56.7 % (ref 43.0–77.0)
RBC: 5.04 Mil/uL (ref 4.22–5.81)
WBC: 8.9 10*3/uL (ref 4.5–10.5)

## 2012-01-29 NOTE — Telephone Encounter (Signed)
Caller: Stephone/Patient; PCP: Nelwyn Salisbury.; CB#: [phone].  Having some shortness of breath and fatigue and dizziness.  Sx started on Tuesday.   Has noticed in the past 2 weeks that he sleeps better in the recliner with his head elevated.  Needs to be seen in 4 hours per Breathing Difficulty protocal.  Scheduled for 1130a.

## 2012-01-29 NOTE — Progress Notes (Signed)
  Subjective:    Patient ID: Dwayne Woodard, male    DOB: 06/25/1956, 56 y.o.   MRN: [id number]  HPI Here for several problems. He has a cpx set up for next month, but he didn't think this could wait until then. He has been very tired for several years, and this is getting worse. He also gets sleepy frequently throughout the day, has trouble watching TV or sitting through meetings without fighting sleep. He is a loud snorer at night, and he is a very restless sleeper. His wife says he seems to stop breathing at night. Also over the past 2 weeks he has had some lightheadedness, especially when standing up or moving his head around. The room does not seem to spin. No HAs or vision changes or neurologic deficits. No SOB or chest pain. He also has some tingling in the hands and feet without swelling. He still smokes 2 ppd.    Review of Systems  Constitutional: Positive for fatigue.  Eyes: Negative.   Respiratory: Negative.   Cardiovascular: Negative.   Gastrointestinal: Negative.   Neurological: Positive for light-headedness. Negative for dizziness, tremors, seizures, syncope, facial asymmetry, speech difficulty, weakness, numbness and headaches.       Objective:   Physical Exam  Constitutional: He is oriented to person, place, and time. He appears well-developed and well-nourished.  HENT:       Narrow posterior OP with large tonsils   Neck: No thyromegaly present.  Cardiovascular: Normal rate, regular rhythm, normal heart sounds and intact distal pulses.   Pulmonary/Chest: Effort normal and breath sounds normal.  Lymphadenopathy:    He has no cervical adenopathy.  Neurological: He is alert and oriented to person, place, and time. He has normal reflexes. No cranial nerve deficit. He exhibits normal muscle tone. Coordination normal.          Assessment & Plan:  It is quite likely that he has sleep apnea, and this could account for a lot of these symptoms. [facility]. Get labs today. His BP is up but it is unclear whether HTN may be playing a role.

## 2012-01-29 NOTE — Telephone Encounter (Signed)
He was seen today

## 2012-02-04 NOTE — Progress Notes (Signed)
Quick Note:  Left message with normal results. ______ 

## 2012-02-11 ENCOUNTER — Telehealth: Payer: Self-pay | Admitting: Family Medicine

## 2012-02-11 NOTE — Telephone Encounter (Signed)
Call in #[phone] with 5 rf 

## 2012-02-11 NOTE — Telephone Encounter (Signed)
Refill request for Alprazolam 1 mg take 1 po tid prn and pt last here on 01/29/12.

## 2012-02-12 MED ORDER — ALPRAZOLAM 1 MG PO TABS: 1.0000 mg | ORAL_TABLET | Freq: Three times a day (TID) | ORAL | Status: DC | PRN

## 2012-02-12 NOTE — Telephone Encounter (Signed)
I called in script 

## 2012-03-10 ENCOUNTER — Encounter: Payer: PRIVATE HEALTH INSURANCE | Admitting: Family Medicine

## 2012-03-18 ENCOUNTER — Ambulatory Visit (INDEPENDENT_AMBULATORY_CARE_PROVIDER_SITE_OTHER): Payer: PRIVATE HEALTH INSURANCE | Admitting: Pulmonary Disease

## 2012-03-18 ENCOUNTER — Encounter: Payer: Self-pay | Admitting: Pulmonary Disease

## 2012-03-18 VITALS — BP 126/90 | HR 94 | Temp 98.0°F | Ht 70.0 in | Wt 233.0 lb

## 2012-03-18 DIAGNOSIS — G4733 Obstructive sleep apnea (adult) (pediatric): Secondary | ICD-10-CM | POA: Insufficient documentation

## 2012-03-18 NOTE — Assessment & Plan Note (Signed)
The patient's history is very suggestive of clinically significant obstructive sleep apnea.  I had a long discussion with him about sleep disordered breathing, including its impact to his quality of life and cardiovascular health.  I think he needs to have a sleep study at this point, and the patient is agreeable.  I have also encouraged him to work aggressively on weight loss.

## 2012-03-18 NOTE — Patient Instructions (Addendum)
Will set up for a sleep study, and arrange followup once the results are available. Work on weight loss  

## 2012-03-18 NOTE — Progress Notes (Signed)
  Subjective:    Patient ID: Dwayne Woodard, male    DOB: 09-Feb-1956, 56 y.o.   MRN: [id number]  HPI The patient is a 56 year old male who I've been asked to see for possible obstructive sleep apnea.  The patient has been noted to have loud snoring, as well as an abnormal breathing pattern during sleep by his wife.  He has also had occasional choking arousals.  He has frequent awakenings at night, and has not rested in the mornings upon arising.  He feels tired all of the time, and notes significant sleep pressure with periods of inactivity during the day.  He can fall asleep easily watching television in the evenings, and has some sleep pressure while driving longer distances.  The patient's weight is increased about 20 pounds over the last 3 years, and his Epworth sleepiness score today is very abnormal at 15.  Sleep Questionnaire: What time do you typically go to bed?( Between what hours) 9pm How long does it take you to fall asleep? 5 minutes How many times during the night do you wake up? 5 What time do you get out of bed to start your day? 0500 Do you drive or operate heavy machinery in your occupation? No How much has your weight changed (up or down) over the past two years? (In pounds) 0 oz (0 kg) Have you ever had a sleep study before? No Do you currently use CPAP? No Do you wear oxygen at any time? No    Review of Systems  Constitutional: Negative for fever and unexpected weight change.  HENT: Positive for trouble swallowing and dental problem. Negative for ear pain, nosebleeds, congestion, sore throat, rhinorrhea, sneezing, postnasal drip and sinus pressure.   Eyes: Negative for redness and itching.  Respiratory: Positive for shortness of breath. Negative for cough, chest tightness and wheezing.   Cardiovascular: Positive for chest pain and leg swelling. Negative for palpitations.  Gastrointestinal: Negative for nausea and vomiting.  Genitourinary: Negative for dysuria.    Musculoskeletal: Positive for arthralgias. Negative for joint swelling.  Skin: Negative for rash.  Neurological: Negative for headaches.  Hematological: Does not bruise/bleed easily.  Psychiatric/Behavioral: Negative for dysphoric mood. The patient is nervous/anxious.   All other systems reviewed and are negative.       Objective:   Physical Exam Constitutional:  Well developed, no acute distress  HENT:  Nares patent without discharge, mild deviation to left with narrowing.   Oropharynx without exudate, palate and uvula are elongated, mild tonsillar hypertrophy  Eyes:  Perrla, eomi, no scleral icterus  Neck:  No JVD, no TMG  Cardiovascular:  Normal rate, regular rhythm, no rubs or gallops.  No murmurs        Intact distal pulses  Pulmonary :  Normal breath sounds, no stridor or respiratory distress   No rales, rhonchi, or wheezing  Abdominal:  Soft, nondistended, bowel sounds present.  No tenderness noted.   Musculoskeletal:  No lower extremity edema noted.  Lymph Nodes:  No cervical lymphadenopathy noted  Skin:  No cyanosis noted  Neurologic:  Appears mildly sleepy, appropriate, moves all 4 extremities without obvious deficit.         Assessment & Plan:

## 2012-04-10 ENCOUNTER — Ambulatory Visit (HOSPITAL_BASED_OUTPATIENT_CLINIC_OR_DEPARTMENT_OTHER): Payer: PRIVATE HEALTH INSURANCE | Attending: Pulmonary Disease | Admitting: Radiology

## 2012-04-10 VITALS — Ht 70.0 in | Wt 230.0 lb

## 2012-04-10 DIAGNOSIS — G4733 Obstructive sleep apnea (adult) (pediatric): Secondary | ICD-10-CM

## 2012-04-10 DIAGNOSIS — G4710 Hypersomnia, unspecified: Secondary | ICD-10-CM | POA: Insufficient documentation

## 2012-04-10 DIAGNOSIS — G4730 Sleep apnea, unspecified: Secondary | ICD-10-CM | POA: Insufficient documentation

## 2012-04-15 ENCOUNTER — Ambulatory Visit (INDEPENDENT_AMBULATORY_CARE_PROVIDER_SITE_OTHER): Payer: PRIVATE HEALTH INSURANCE | Admitting: Family Medicine

## 2012-04-15 ENCOUNTER — Encounter: Payer: Self-pay | Admitting: Family Medicine

## 2012-04-15 VITALS — BP 130/86 | HR 99 | Temp 98.3°F | Ht 70.25 in | Wt 238.0 lb

## 2012-04-15 DIAGNOSIS — Z0000 Encounter for general adult medical examination without abnormal findings: Secondary | ICD-10-CM

## 2012-04-15 LAB — PSA: PSA: 0.92 ng/mL (ref 0.10–4.00)

## 2012-04-15 LAB — LIPID PANEL
HDL: 43.7 mg/dL (ref >39.00)
Total CHOL/HDL Ratio: 5
Triglycerides: 188 mg/dL — ABNORMAL HIGH (ref 0.0–149.0)
VLDL: 37.6 mg/dL (ref 0.0–40.0)

## 2012-04-15 NOTE — Progress Notes (Signed)
  Subjective:    Patient ID: Dwayne Woodard, male    DOB: September 14, 1955, 56 y.o.   MRN: [id number]  HPI 56 yr old male for a cpx. He feels well except for chronic fatigue and sleepiness, which I am sure is due in part to sleep apnea. He recently underwent a sleep study per Dr. Shelle Iron, and he is waiting on these results to come back.    Review of Systems  Constitutional: Positive for fatigue. Negative for fever, chills, diaphoresis, activity change, appetite change and unexpected weight change.  HENT: Negative.   Eyes: Negative.   Respiratory: Negative.   Cardiovascular: Negative.   Gastrointestinal: Negative.   Genitourinary: Negative.   Musculoskeletal: Negative.   Skin: Negative.   Neurological: Negative.   Hematological: Negative.   Psychiatric/Behavioral: Negative.        Objective:   Physical Exam  Constitutional: He is oriented to person, place, and time. He appears well-developed and well-nourished. No distress.  HENT:  Head: Normocephalic and atraumatic.  Right Ear: External ear normal.  Left Ear: External ear normal.  Nose: Nose normal.  Mouth/Throat: Oropharynx is clear and moist. No oropharyngeal exudate.  Eyes: Conjunctivae and EOM are normal. Pupils are equal, round, and reactive to light. Right eye exhibits no discharge. Left eye exhibits no discharge. No scleral icterus.  Neck: Neck supple. No JVD present. No tracheal deviation present. No thyromegaly present.  Cardiovascular: Normal rate, regular rhythm, normal heart sounds and intact distal pulses.  Exam reveals no gallop and no friction rub.   No murmur heard.      EKG normal   Pulmonary/Chest: Effort normal and breath sounds normal. No respiratory distress. He has no wheezes. He has no rales. He exhibits no tenderness.  Abdominal: Soft. Bowel sounds are normal. He exhibits no distension and no mass. There is no tenderness. There is no rebound and no guarding.  Genitourinary: Rectum normal, prostate normal and  penis normal. Guaiac negative stool. No penile tenderness.  Musculoskeletal: Normal range of motion. He exhibits no edema and no tenderness.  Lymphadenopathy:    He has no cervical adenopathy.  Neurological: He is alert and oriented to person, place, and time. He has normal reflexes. No cranial nerve deficit. He exhibits normal muscle tone. Coordination normal.  Skin: Skin is warm and dry. No rash noted. He is not diaphoretic. No erythema. No pallor.  Psychiatric: He has a normal mood and affect. His behavior is normal. Judgment and thought content normal.          Assessment & Plan:  wel exam. He needs to lose some weight. Get a few more labs today

## 2012-04-17 MED ORDER — ATORVASTATIN CALCIUM 20 MG PO TABS: 20.0000 mg | ORAL_TABLET | Freq: Every day | ORAL | Status: DC

## 2012-04-17 NOTE — Progress Notes (Signed)
Quick Note:  I spoke with pt and sent script e-scribe. ______ 

## 2012-04-17 NOTE — Addendum Note (Signed)
Addended by: Aniceto Boss A on: 04/17/2012 04:16 PM   Modules accepted: Orders

## 2012-04-19 DIAGNOSIS — G4710 Hypersomnia, unspecified: Secondary | ICD-10-CM

## 2012-04-19 DIAGNOSIS — G4730 Sleep apnea, unspecified: Secondary | ICD-10-CM

## 2012-04-20 NOTE — Procedures (Signed)
NAME:  Dwayne Woodard, Dwayne Woodard NO.:  [id number]  MEDICAL RECORD NO.:  [id number]          PATIENT TYPE:  OUT  LOCATION:  SLEEP CENTER                 FACILITY:  [facility]  PHYSICIAN:  Barbaraann Share, MD,FCCPDATE OF BIRTH:  1956-06-23  DATE OF STUDY:  04/10/2012                           NOCTURNAL POLYSOMNOGRAM  REFERRING PHYSICIAN:  Barbaraann Share, MD,FCCP  INDICATION FOR STUDY:  Hypersomnia with sleep apnea.  EPWORTH SLEEPINESS SCORE:  5.  SLEEP ARCHITECTURE:  The patient had a total sleep time of 199 minutes with no slow-wave sleep and only 1.5 minutes of REM.  Sleep onset latency was normal at 27 minutes and REM onset was normal at 107 minutes.  Sleep efficiency was very poor at 56%.  RESPIRATORY DATA:  The patient was found to have 1 apnea and 8 obstructive hypopneas, giving him an apnea-hypopnea index of only 3 events per hour.  The events occurred in all body positions and there was moderate snoring noted throughout.  OXYGEN DATA:  There was O2 desaturation transiently as low as 86% with the patient's obstructive events.  CARDIAC DATA:  No clinically significant arrhythmias were seen.  MOVEMENTS/PARASOMNIA:  The patient was found to have 78 periodic limb movements, with 4 per hour resulting in arousal or awakening.  There was no abnormal behaviors seen.  IMPRESSION-RECOMMENDATIONS: 1. Small numbers of obstructive events, which do not meet the AHI     criteria for the obstructive sleep apnea syndrome.  The patient did     have a short total sleep time with very little slow-wave sleep or     REM; however, he had more than adequate time to exhibit clinically     significant sleep apnea. 2. Moderate numbers of leg jerks with definite sleep disruption.     Clinical correlation is suggested to see if the patient may have a     primary movement disorder of sleep.     Barbaraann Share, MD,FCCP Diplomate, American Board of Sleep Medicine   [facility]/MEDQ  D:   04/19/2012 15:01:45  T:  04/20/2012 02:46:54  Job:  [id number]

## 2012-04-21 ENCOUNTER — Telehealth: Payer: Self-pay | Admitting: Pulmonary Disease

## 2012-04-21 MED ORDER — ROPINIROLE HCL 0.5 MG PO TABS: ORAL_TABLET | ORAL | Status: DC

## 2012-04-21 NOTE — Telephone Encounter (Signed)
The pt's NPSG showed only an AHI 3/hr, but only had of sleep.  He did have 78 PLMS, with 4/hr causing arousal or awakening. The pt does note an abnormal sensation in his legs in the evenings that is improved with movement.  His wife also complains about his leg kicks.   Will try requip 0.5mg   Take 1-2 each evening after supper for next 3 weeks.  He is to call me in 3 weeks with an update  Triage, please send in script for requip for pt with above directions.  #50, no fills.  To his pharmacy in [place].

## 2012-04-21 NOTE — Telephone Encounter (Signed)
rx sent. Dwayne Woodard, CMA  

## 2012-04-22 ENCOUNTER — Telehealth: Payer: Self-pay | Admitting: Pulmonary Disease

## 2012-04-22 NOTE — Telephone Encounter (Signed)
Let him know to work on weight loss and staying off his back while sleeping to treat his snoring.  If his leg symptoms continue, and he is not resting well and is sleepy during the day, let us know and we can try a different medication than requip for his leg movements.

## 2012-04-22 NOTE — Telephone Encounter (Signed)
Pt returned call. Dwayne Woodard  

## 2012-04-22 NOTE — Telephone Encounter (Signed)
LMOMTCB x 1 

## 2012-04-22 NOTE — Telephone Encounter (Signed)
PT reports that he still doesn't feel good after taking this med last night and is not willing to try this again.  Pt states that he may just give up on all treatment.  Instructed pt that I would let Dr Shelle Iron know and would call him back for further recommendations.

## 2012-04-22 NOTE — Telephone Encounter (Signed)
Called, spoke with pt.   I informed him of below per Dr. Shelle Iron.  He verbalized understanding of this and voiced no further questions/concerns at this time.

## 2012-04-22 NOTE — Telephone Encounter (Signed)
He was supposed to start with one, then take a second if he tolerated but had persistent symptoms.  See if he is willing to try again with ONE at BEDTIME instead of after dinner to see if better tolerated.

## 2012-04-22 NOTE — Telephone Encounter (Signed)
Pt reports that he took first dose of Requip (2 tablets) yesterday at 7:00 PM.  Pt complains that 2 hrs afer he woke up with headache and didn't feel well.  He then went to recliner to try to rest.  Pt woke up feeling very groggy this morning.  Pt does not want to take anymore of this med and he actually through the rest of the medicine in the trash.  Please advise.

## 2012-08-19 ENCOUNTER — Other Ambulatory Visit: Payer: Self-pay | Admitting: Family Medicine

## 2012-08-21 ENCOUNTER — Other Ambulatory Visit: Payer: Self-pay | Admitting: Family Medicine

## 2012-08-21 NOTE — Telephone Encounter (Signed)
Call in #[phone] with 5 rf 

## 2012-10-08 ENCOUNTER — Other Ambulatory Visit: Payer: Self-pay | Admitting: Orthopedic Surgery

## 2012-10-09 ENCOUNTER — Encounter (HOSPITAL_BASED_OUTPATIENT_CLINIC_OR_DEPARTMENT_OTHER): Payer: Self-pay | Admitting: *Deleted

## 2012-10-09 ENCOUNTER — Other Ambulatory Visit: Payer: Self-pay | Admitting: Orthopedic Surgery

## 2012-10-09 NOTE — Progress Notes (Signed)
Pt snores but had mild sleep study 2013 dr clance No cardiac or resp problems but is a 2 ppd smoker

## 2012-10-12 ENCOUNTER — Encounter (HOSPITAL_BASED_OUTPATIENT_CLINIC_OR_DEPARTMENT_OTHER): Payer: Self-pay | Admitting: Anesthesiology

## 2012-10-12 ENCOUNTER — Encounter (HOSPITAL_BASED_OUTPATIENT_CLINIC_OR_DEPARTMENT_OTHER)
Admission: RE | Disposition: A | Discharge status: Home / Self Care | Payer: Self-pay | Source: Ambulatory Visit | Attending: Orthopedic Surgery

## 2012-10-12 ENCOUNTER — Ambulatory Visit (HOSPITAL_BASED_OUTPATIENT_CLINIC_OR_DEPARTMENT_OTHER)
Admission: RE | Admit: 2012-10-12 | Discharge: 2012-10-12 | Disposition: A | Discharge status: Home / Self Care | Payer: PRIVATE HEALTH INSURANCE | Source: Ambulatory Visit | Attending: Orthopedic Surgery | Admitting: Orthopedic Surgery

## 2012-10-12 ENCOUNTER — Encounter (HOSPITAL_BASED_OUTPATIENT_CLINIC_OR_DEPARTMENT_OTHER): Payer: Self-pay

## 2012-10-12 ENCOUNTER — Ambulatory Visit (HOSPITAL_BASED_OUTPATIENT_CLINIC_OR_DEPARTMENT_OTHER): Payer: PRIVATE HEALTH INSURANCE | Admitting: Anesthesiology

## 2012-10-12 DIAGNOSIS — J4489 Other specified chronic obstructive pulmonary disease: Secondary | ICD-10-CM | POA: Insufficient documentation

## 2012-10-12 DIAGNOSIS — K219 Gastro-esophageal reflux disease without esophagitis: Secondary | ICD-10-CM | POA: Insufficient documentation

## 2012-10-12 DIAGNOSIS — F411 Generalized anxiety disorder: Secondary | ICD-10-CM | POA: Insufficient documentation

## 2012-10-12 DIAGNOSIS — M23319 Other meniscus derangements, anterior horn of medial meniscus, unspecified knee: Secondary | ICD-10-CM | POA: Insufficient documentation

## 2012-10-12 DIAGNOSIS — G4730 Sleep apnea, unspecified: Secondary | ICD-10-CM | POA: Insufficient documentation

## 2012-10-12 DIAGNOSIS — M23302 Other meniscus derangements, unspecified lateral meniscus, unspecified knee: Secondary | ICD-10-CM | POA: Insufficient documentation

## 2012-10-12 DIAGNOSIS — M23359 Other meniscus derangements, posterior horn of lateral meniscus, unspecified knee: Secondary | ICD-10-CM | POA: Insufficient documentation

## 2012-10-12 DIAGNOSIS — F17200 Nicotine dependence, unspecified, uncomplicated: Secondary | ICD-10-CM | POA: Insufficient documentation

## 2012-10-12 HISTORY — DX: Anxiety disorder, unspecified: F41.9

## 2012-10-12 HISTORY — DX: Restless legs syndrome: G25.81

## 2012-10-12 HISTORY — PX: KNEE ARTHROSCOPY WITH MEDIAL MENISECTOMY: SHX5651

## 2012-10-12 HISTORY — DX: Snoring: R06.83

## 2012-10-12 HISTORY — DX: Sleep apnea, unspecified: G47.30

## 2012-10-12 LAB — POCT HEMOGLOBIN-HEMACUE: Hemoglobin: 16.3 g/dL (ref 13.0–17.0)

## 2012-10-12 SURGERY — ARTHROSCOPY, KNEE, WITH MEDIAL MENISCECTOMY
Anesthesia: General | Site: Knee | Laterality: Right | Wound class: Clean

## 2012-10-12 MED ORDER — LIDOCAINE HCL (CARDIAC) 20 MG/ML IV SOLN
INTRAVENOUS | Status: DC | PRN
  Administered 2012-10-12: 50 mg via INTRAVENOUS

## 2012-10-12 MED ORDER — OXYCODONE HCL 5 MG/5ML PO SOLN: 5.0000 mg | Freq: Once | ORAL | Status: DC | PRN

## 2012-10-12 MED ORDER — LACTATED RINGERS IV SOLN
INTRAVENOUS | Status: DC
  Administered 2012-10-12 (×2): via INTRAVENOUS

## 2012-10-12 MED ORDER — SODIUM CHLORIDE 0.9 % IR SOLN
Status: DC | PRN
  Administered 2012-10-12: 6000 mL

## 2012-10-12 MED ORDER — POVIDONE-IODINE 7.5 % EX SOLN: Freq: Once | CUTANEOUS | Status: DC

## 2012-10-12 MED ORDER — OXYCODONE-ACETAMINOPHEN 5-325 MG PO TABS: 1.0000 | ORAL_TABLET | ORAL | Status: DC | PRN

## 2012-10-12 MED ORDER — FENTANYL CITRATE 0.05 MG/ML IJ SOLN
INTRAMUSCULAR | Status: DC | PRN
  Administered 2012-10-12: 50 ug via INTRAVENOUS
  Administered 2012-10-12: 100 ug via INTRAVENOUS

## 2012-10-12 MED ORDER — ONDANSETRON HCL 4 MG/2ML IJ SOLN
INTRAMUSCULAR | Status: DC | PRN
  Administered 2012-10-12: 4 mg via INTRAVENOUS

## 2012-10-12 MED ORDER — HYDROMORPHONE HCL PF 1 MG/ML IJ SOLN
0.2500 mg | INTRAMUSCULAR | Status: DC | PRN
  Administered 2012-10-12: 0.5 mg via INTRAVENOUS

## 2012-10-12 MED ORDER — OXYCODONE HCL 5 MG PO TABS: 5.0000 mg | ORAL_TABLET | Freq: Once | ORAL | Status: DC | PRN

## 2012-10-12 MED ORDER — DEXAMETHASONE SODIUM PHOSPHATE 4 MG/ML IJ SOLN
INTRAMUSCULAR | Status: DC | PRN
  Administered 2012-10-12: 10 mg via INTRAVENOUS

## 2012-10-12 MED ORDER — FENTANYL CITRATE 0.05 MG/ML IJ SOLN: 50.0000 ug | INTRAMUSCULAR | Status: DC | PRN

## 2012-10-12 MED ORDER — MIDAZOLAM HCL 2 MG/2ML IJ SOLN: 1.0000 mg | INTRAMUSCULAR | Status: DC | PRN

## 2012-10-12 MED ORDER — METOCLOPRAMIDE HCL 5 MG/ML IJ SOLN: 10.0000 mg | Freq: Once | INTRAMUSCULAR | Status: DC | PRN

## 2012-10-12 MED ORDER — CEFAZOLIN SODIUM-DEXTROSE 2-3 GM-% IV SOLR
2.0000 g | INTRAVENOUS | Status: AC
Start: 2012-10-12 — End: 2012-10-12
  Administered 2012-10-12: 2 g via INTRAVENOUS

## 2012-10-12 MED ORDER — PROPOFOL 10 MG/ML IV BOLUS
INTRAVENOUS | Status: DC | PRN
  Administered 2012-10-12: 300 mg via INTRAVENOUS
  Administered 2012-10-12: 100 mg via INTRAVENOUS

## 2012-10-12 MED ORDER — MIDAZOLAM HCL 5 MG/5ML IJ SOLN
INTRAMUSCULAR | Status: DC | PRN
  Administered 2012-10-12: 2 mg via INTRAVENOUS

## 2012-10-12 MED ORDER — CEFAZOLIN SODIUM-DEXTROSE 2-3 GM-% IV SOLR: 2.0000 g | INTRAVENOUS | Status: DC

## 2012-10-12 SURGICAL SUPPLY — 31 items
BANDAGE ELASTIC 6 VELCRO ST LF (GAUZE/BANDAGES/DRESSINGS) ×2 IMPLANT
BLADE 4.2CUDA (BLADE) ×2 IMPLANT
BLADE CUTTER GATOR 3.5 (BLADE) IMPLANT
BLADE CUTTER MENIS 5.5 (BLADE) IMPLANT
CANISTER OMNI JUG 16 LITER (MISCELLANEOUS) ×2 IMPLANT
CANISTER SUCTION 2500CC (MISCELLANEOUS) IMPLANT
CHLORAPREP W/TINT 26ML (MISCELLANEOUS) ×2 IMPLANT
CLOTH BEACON ORANGE TIMEOUT ST (SAFETY) ×2 IMPLANT
DRAPE ARTHROSCOPY W/POUCH 114 (DRAPES) ×2 IMPLANT
DRSG EMULSION OIL 3X3 NADH (GAUZE/BANDAGES/DRESSINGS) ×2 IMPLANT
GLOVE BIO SURGEON STRL SZ 6.5 (GLOVE) ×2 IMPLANT
GLOVE BIO SURGEON STRL SZ7 (GLOVE) ×2 IMPLANT
GLOVE BIO SURGEON STRL SZ7.5 (GLOVE) ×2 IMPLANT
GLOVE BIOGEL M STRL SZ7.5 (GLOVE) ×2 IMPLANT
GLOVE BIOGEL PI IND STRL 7.0 (GLOVE) ×2 IMPLANT
GLOVE BIOGEL PI IND STRL 8 (GLOVE) ×1 IMPLANT
GLOVE BIOGEL PI INDICATOR 7.0 (GLOVE) ×2
GLOVE BIOGEL PI INDICATOR 8 (GLOVE) ×1
GOWN PREVENTION PLUS XLARGE (GOWN DISPOSABLE) ×6 IMPLANT
GOWN PREVENTION PLUS XXLARGE (GOWN DISPOSABLE) ×2 IMPLANT
IV NS IRRIG 3000ML ARTHROMATIC (IV SOLUTION) ×4 IMPLANT
KNEE WRAP E Z 3 GEL PACK (MISCELLANEOUS) ×2 IMPLANT
PACK ARTHROSCOPY DSU (CUSTOM PROCEDURE TRAY) ×2 IMPLANT
PACK BASIN DAY SURGERY FS (CUSTOM PROCEDURE TRAY) ×2 IMPLANT
SPONGE GAUZE 4X4 12PLY (GAUZE/BANDAGES/DRESSINGS) ×2 IMPLANT
SUT ETHILON 4 0 PS 2 18 (SUTURE) ×2 IMPLANT
TOWEL OR 17X24 6PK STRL BLUE (TOWEL DISPOSABLE) ×2 IMPLANT
TOWEL OR NON WOVEN STRL DISP B (DISPOSABLE) ×2 IMPLANT
TUBING ARTHROSCOPY IRRIG 16FT (MISCELLANEOUS) ×2 IMPLANT
WAND STAR VAC 90 (SURGICAL WAND) IMPLANT
WATER STERILE IRR 1000ML POUR (IV SOLUTION) ×2 IMPLANT

## 2012-10-12 NOTE — Anesthesia Procedure Notes (Signed)
Procedure Name: LMA Insertion Date/Time: 10/12/2012 11:42 AM Performed by: Caren Macadam Pre-anesthesia Checklist: Patient identified, Emergency Drugs available, Suction available and Patient being monitored Patient Re-evaluated:Patient Re-evaluated prior to inductionOxygen Delivery Method: Circle System Utilized Preoxygenation: Pre-oxygenation with 100% oxygen Intubation Type: IV induction Ventilation: Mask ventilation without difficulty LMA: LMA inserted LMA Size: 5.0 Number of attempts: 1 Airway Equipment and Method: bite block Placement Confirmation: positive ETCO2 and breath sounds checked- equal and bilateral Tube secured with: Tape Dental Injury: Teeth and Oropharynx as per pre-operative assessment

## 2012-10-12 NOTE — Anesthesia Preprocedure Evaluation (Signed)
Anesthesia Evaluation  Patient identified by MRN, date of birth, ID band Patient awake    Reviewed: Allergy & Precautions, H&P , NPO status , Patient's Chart, lab work & pertinent test results, reviewed documented beta blocker date and time   Airway Mallampati: II TM Distance: >3 FB Neck ROM: full    Dental   Pulmonary neg pulmonary ROS, sleep apnea , COPDCurrent Smoker,  breath sounds clear to auscultation        Cardiovascular negative cardio ROS  Rhythm:regular     Neuro/Psych PSYCHIATRIC DISORDERS Anxiety negative neurological ROS     GI/Hepatic Neg liver ROS, GERD-  Medicated and Controlled,  Endo/Other  negative endocrine ROS  Renal/GU negative Renal ROS  negative genitourinary   Musculoskeletal   Abdominal   Peds  Hematology negative hematology ROS (+)   Anesthesia Other Findings See surgeon's H&P   Reproductive/Obstetrics negative OB ROS                           Anesthesia Physical Anesthesia Plan  ASA: III  Anesthesia Plan: General   Post-op Pain Management:    Induction: Intravenous  Airway Management Planned: LMA  Additional Equipment:   Intra-op Plan:   Post-operative Plan: Extubation in OR  Informed Consent: I have reviewed the patients History and Physical, chart, labs and discussed the procedure including the risks, benefits and alternatives for the proposed anesthesia with the patient or authorized representative who has indicated his/her understanding and acceptance.   Dental Advisory Given  Plan Discussed with: CRNA and Surgeon  Anesthesia Plan Comments:         Anesthesia Quick Evaluation
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
## 2012-10-12 NOTE — Op Note (Signed)
Procedure(s): RIGHT KNEE ARTHROSCOPY WITH PARTIAL MEDIAL MENISECTOMY AND CONDROPLASTY Procedure Note  KLAYTON MONIE male 57 y.o. 10/12/2012  Procedure(s) and Anesthesia Type:    * RIGHT KNEE ARTHROSCOPY WITH PARTIAL MEDIAL MENISECTOMY AND ChONDROPLASTY trochlea - General  Surgeon(s) and Role:    * Mable Paris, MD - Primary     Surgeon: Mable Paris   Assistants: Damita Lack PA-C [facility] was present and scrubbed throughout the procedure and was essential in positioning, assisting with the camera and instrumentation,, and closure)  Anesthesia: General endotracheal anesthesia    Procedure Detail  RIGHT KNEE ARTHROSCOPY WITH PARTIAL MEDIAL MENISECTOMY AND CONDROPLASTY  Estimated Blood Loss: Min         Drains: none  Blood Given: none         Specimens: none        Complications:  * No complications entered in OR log *         Disposition: PACU - hemodynamically stable.         Condition: stable    Procedure:   INDICATIONS FOR SURGERY: The patient is 57 y.o. male who has had a long history of right knee pain. He had an MRI which revealed a anterior horn medial meniscus tear with a small cyst. He continued to have symptoms despite activity modification, rest and injection therapy. Ultimately he was indicated for arthroscopic surgery.  OPERATIVE FINDINGS: Examination under anesthesia: No stiffness or instability. Diagnostic Arthroscopy:  articular cartilage: Medial and lateral compartments overall looked pretty nightly. There is some mild thinning medially but no fragmentation. I would call it grade 1. He did have some grade 3 changes on the femoral trocar he period and some grade 2 and 3 changes on the undersurface of the patella. These were addressed with a chondroplasty/abrasion arthroplasty. Medial meniscus: He did have the anterior horn meniscus tear and cyst seen on MRI which were addressed with debridement/partial meniscectomy, but he  also had a posterior horn medial meniscus tear at the junction of the posterior horn and body. This was a small parrot-beak tear through about half the thickness of the meniscus. Lateral meniscus: Intact Anterior cruciate ligament/PCL: Intact Loose bodies: None  DESCRIPTION OF PROCEDURE: The patient was identified in preoperative  holding area where I personally marked the operative site after  verifying site, side, and procedure with the patient.  The patient was taken back to the operating room where general anesthesia was induced without complication and was placed in the supine position with the operative leg placed in a padded leg holder. The foot of the bed was dropped. The opposite extremity was well padded.  The right lower extremity was then prepped and  draped in a standard sterile fashion. The appropriate time-out  procedure was carried out. The patient did receive IV antibiotics  within 30 minutes of incision.   A small stab incision was made in the anterolateral portal position. The arthroscope was introduced in the joint. A medial portal was then established under direct visualization just above the anterior horn of the medial meniscus. Diagnostic arthroscopy was then carried out with findings as described above.  The medial compartment was addressed first. He had some mild thinning of the cartilage and some grade 1 changes but no chondral flaps or exposed bone. The anterior horn was examined and found to have a split tear with inner one third involved. There is also associated cyst just medial to this. The cyst was debrided and completely excised. The anterior horn meniscus  tear was also debrided with shaver. He actually had a more significant posterior horn meniscal tear with small parrot-beak tear involving about half the thickness of the meniscus at the junction of the posterior horn and body. This was addressed with a large biter and shaver to bring it back to healthy meniscus  which was displaceable.  The knee was placed in a figure 4 position the lateral compartment was examined. Cartilage and meniscus were intact. Attention was then turned to the trochlea and undersurface of the patella where he was noted to have some grade 3 changes in the central trochlea and grade 2 and 3 changes on the undersurface of the patella. The shaver was used through the medial portal to perform an abrasion arthroplasty/chondroplasty of the trochlea removing the unstable cartilaginous flaps. This was carried down to bleeding bone centrally.  The arthroscopic equipment was removed from the joint and the portals were closed with 3-0 nylon in an interrupted fashion. The knee was infiltrated with 20 cc quarter percent Marcaine without epinephrine.  Sterile dressings were then applied including Xeroform 4 x 4's ABDs an ACE bandage.  The patient was then allowed to awaken from general anesthesia, transferred to the stretcher and taken to the recovery room in stable condition.   POSTOPERATIVE PLAN: The patient will be discharged home today and will followup in one week for suture removal and wound check.

## 2012-10-12 NOTE — Transfer of Care (Signed)
Immediate Anesthesia Transfer of Care Note  Patient: Dwayne Woodard  Procedure(s) Performed: Procedure(s): RIGHT KNEE ARTHROSCOPY WITH PARTIAL MEDIAL MENISECTOMY AND CONDROPLASTY (Right)  Patient Location: PACU  Anesthesia Type:General  Level of Consciousness: awake and alert   Airway & Oxygen Therapy: Patient Spontanous Breathing and Patient connected to face mask oxygen  Post-op Assessment: Report given to PACU RN and Post -op Vital signs reviewed and stable  Post vital signs: Reviewed and stable  Complications: No apparent anesthesia complications

## 2012-10-12 NOTE — Anesthesia Postprocedure Evaluation (Signed)
Anesthesia Post Note  Patient: Dwayne Woodard  Procedure(s) Performed: Procedure(s) (LRB): RIGHT KNEE ARTHROSCOPY WITH PARTIAL MEDIAL MENISECTOMY AND CONDROPLASTY (Right)  Anesthesia type: General  Patient location: PACU  Post pain: Pain level controlled  Post assessment: Patient's Cardiovascular Status Stable  Last Vitals:  Filed Vitals:   10/12/12 1300  BP: 160/97  Pulse: 91  Temp:   Resp: 14    Post vital signs: Reviewed and stable  Level of consciousness: alert  Complications: No apparent anesthesia complications

## 2012-10-13 ENCOUNTER — Encounter (HOSPITAL_BASED_OUTPATIENT_CLINIC_OR_DEPARTMENT_OTHER): Payer: Self-pay | Admitting: Orthopedic Surgery

## 2013-03-20 ENCOUNTER — Other Ambulatory Visit: Payer: Self-pay | Admitting: Family Medicine

## 2013-03-23 NOTE — Telephone Encounter (Signed)
Call in #90 with one rf. He will need an OV next month

## 2013-04-20 ENCOUNTER — Encounter: Payer: PRIVATE HEALTH INSURANCE | Admitting: Family Medicine

## 2013-04-20 DIAGNOSIS — Z0289 Encounter for other administrative examinations: Secondary | ICD-10-CM

## 2013-06-21 ENCOUNTER — Telehealth: Payer: Self-pay | Admitting: Family Medicine

## 2013-06-21 NOTE — Telephone Encounter (Signed)
He should go to either the ER or to Urgent Care today

## 2013-06-21 NOTE — Telephone Encounter (Signed)
I spoke with pt and he does not want to go to either today. He is going to schedule for tomorrow 06/22/13 with you. I did left Dr. Clent Ridges know and he said this was okay.

## 2013-06-21 NOTE — Telephone Encounter (Signed)
Patient Information:  Caller Name: [place]  Phone: [phone]  Patient: Dwayne Woodard, Dwayne Woodard  Gender: Male  DOB: [birth year]  Age: 57 Years  PCP: G[facility])  Office Follow Up:  Does the office need to follow up with this patient?: Yes  Instructions For The Office: Office please call pt regarding appt  RN Note:  Office called and instructed that Dr Clent Ridges will be made ware and will call pt back after talking to MD; pt aware and will wait for office to call back.  Symptoms  Reason For Call & Symptoms: Pt is calling and states that he has been dizzy for the last couple months on and off; the dizziness has been present about 2 hours so far today with no relief; the dizziness has been more frequent now;  BP 167/90 taken by RN at his work; has had bleeding from rectum for years and MD is aware per pt; did have internal hemorroids; last time had rectal bleeding was last week;  turned water red; x1 since last week;  Pt states that MD is aware of this bleeding and this is nothing new; unable to take pulse; RN tempted to take over the phone but pt was unable to follow instructions to properly take reading  Reviewed Health History In EMR: Yes  Reviewed Medications In EMR: Yes  Reviewed Allergies In EMR: Yes  Reviewed Surgeries / Procedures: Yes  Date of Onset of Symptoms: Unknown  Guideline(s) Used:  Dizziness  Disposition Per Guideline:   Go to ED Now (or to Office with PCP Approval)  Reason For Disposition Reached:   Follows bleeding (e.g., stomach, rectum, vagina) (Exception: became dizzy from sight of small amount blood)  Advice Given:  N/A  RN Overrode Recommendation:  Follow Up With Office Later  Office to consult with Dr Clent Ridges and call pt back with further instructions

## 2013-06-22 ENCOUNTER — Encounter: Payer: Self-pay | Admitting: Family Medicine

## 2013-06-22 ENCOUNTER — Ambulatory Visit (INDEPENDENT_AMBULATORY_CARE_PROVIDER_SITE_OTHER): Payer: PRIVATE HEALTH INSURANCE | Admitting: Family Medicine

## 2013-06-22 VITALS — BP 144/92 | HR 91 | Temp 97.8°F | Wt 245.0 lb

## 2013-06-22 DIAGNOSIS — I10 Essential (primary) hypertension: Secondary | ICD-10-CM | POA: Insufficient documentation

## 2013-06-22 MED ORDER — METOPROLOL SUCCINATE ER 50 MG PO TB24: 50.0000 mg | ORAL_TABLET | Freq: Every day | ORAL | Status: DC

## 2013-06-22 NOTE — Progress Notes (Signed)
  Subjective:    Patient ID: [name], male    DOB: 1955/09/04, 57 y.o.   MRN: [id number]  HPI Here for several weeks of elevated BP and rapid heart rates. 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 He felt lightheaded at that time but he had no SOB or chest pain. When he sat down to eat lunch his BP went down and he felt better. Today he feels fine again.   Review of Systems  Constitutional: Negative.   Respiratory: Negative.   Cardiovascular: Negative.   Neurological: Positive for light-headedness. Negative for tremors, seizures, syncope, speech difficulty, weakness, numbness and headaches.       Objective:   Physical Exam  Constitutional: He is oriented to person, place, and time. He appears well-developed and well-nourished. No distress.  Neck: No thyromegaly present.  Cardiovascular: Normal rate, regular rhythm, normal heart sounds and intact distal pulses.   Pulmonary/Chest: Effort normal and breath sounds normal.  Lymphadenopathy:    He has no cervical adenopathy.  Neurological: He is alert and oriented to person, place, and time.          Assessment & Plan:  We will start him on Metoprolol succinate daily. He is already set up for a cpx with fasting labs next week. We will get an EKG and recheck the BP at that time.

## 2013-06-29 ENCOUNTER — Encounter: Payer: Self-pay | Admitting: Family Medicine

## 2013-06-29 ENCOUNTER — Ambulatory Visit (INDEPENDENT_AMBULATORY_CARE_PROVIDER_SITE_OTHER): Payer: PRIVATE HEALTH INSURANCE | Admitting: Family Medicine

## 2013-06-29 VITALS — BP 130/78 | HR 83 | Temp 98.0°F | Ht 70.0 in | Wt 238.0 lb

## 2013-06-29 DIAGNOSIS — Z0000 Encounter for general adult medical examination without abnormal findings: Secondary | ICD-10-CM

## 2013-06-29 LAB — CBC WITH DIFFERENTIAL/PLATELET
Basophils Absolute: 0 10*3/uL (ref 0.0–0.1)
Eosinophils Absolute: 0.1 10*3/uL (ref 0.0–0.7)
HCT: 43.8 % (ref 39.0–52.0)
Hemoglobin: 14.7 g/dL (ref 13.0–17.0)
Lymphocytes Relative: 28 % (ref 12.0–46.0)
MCHC: 33.6 g/dL (ref 30.0–36.0)
MCV: 93.2 fl (ref 78.0–100.0)
Monocytes Absolute: 0.9 10*3/uL (ref 0.1–1.0)
Monocytes Relative: 8.5 % (ref 3.0–12.0)
Neutro Abs: 6.7 10*3/uL (ref 1.4–7.7)
Platelets: 257 10*3/uL (ref 150.0–400.0)
RDW: 12.7 % (ref 11.5–14.6)

## 2013-06-29 LAB — BASIC METABOLIC PANEL
BUN: 20 mg/dL (ref 6–23)
Chloride: 99 mEq/L (ref 96–112)
Creatinine, Ser: 1 mg/dL (ref 0.4–1.5)
Glucose, Bld: 89 mg/dL (ref 70–99)
Potassium: 4.9 mEq/L (ref 3.5–5.1)

## 2013-06-29 LAB — POCT URINALYSIS DIPSTICK
Blood, UA: NEGATIVE
Ketones, UA: NEGATIVE
Leukocytes, UA: NEGATIVE
Nitrite, UA: NEGATIVE
Urobilinogen, UA: 2
pH, UA: 6

## 2013-06-29 LAB — TSH: TSH: 3.75 u[IU]/mL (ref 0.35–5.50)

## 2013-06-29 LAB — HEPATIC FUNCTION PANEL
ALT: 23 U/L (ref 0–53)
AST: 16 U/L (ref 0–37)
Albumin: 3.7 g/dL (ref 3.5–5.2)
Total Bilirubin: 1.1 mg/dL (ref 0.3–1.2)
Total Protein: 7.2 g/dL (ref 6.0–8.3)

## 2013-06-29 LAB — LIPID PANEL
Cholesterol: 213 mg/dL — ABNORMAL HIGH (ref 0–200)
Triglycerides: 132 mg/dL (ref 0.0–149.0)

## 2013-06-29 LAB — PSA: PSA: 0.71 ng/mL (ref 0.10–4.00)

## 2013-06-29 NOTE — Progress Notes (Signed)
  Subjective:    Patient ID: Dwayne Woodard, male    DOB: 03/15/1956, 57 y.o.   MRN: [id number]  HPI 57 yr old male for a cpx. He feel well today. We saw him one week ago for high BP and feeling weak. He started on Metoprolol and he feels much better now. The BP has been stable. He has occasional bleeding from hemorrhoids but BMs are not painful. His stools are loose and he never has to strain to pass a BM.    Review of Systems  Constitutional: Negative.   HENT: Negative.   Eyes: Negative.   Respiratory: Negative.   Cardiovascular: Negative.   Gastrointestinal: Positive for anal bleeding. Negative for nausea, abdominal pain, diarrhea, constipation, blood in stool, abdominal distention and rectal pain.  Genitourinary: Negative.   Musculoskeletal: Negative.   Skin: Negative.   Neurological: Negative.   Psychiatric/Behavioral: Negative.        Objective:   Physical Exam  Constitutional: He is oriented to person, place, and time. He appears well-developed and well-nourished. No distress.  HENT:  Head: Normocephalic and atraumatic.  Right Ear: External ear normal.  Left Ear: External ear normal.  Nose: Nose normal.  Mouth/Throat: Oropharynx is clear and moist. No oropharyngeal exudate.  Eyes: Conjunctivae and EOM are normal. Pupils are equal, round, and reactive to light. Right eye exhibits no discharge. Left eye exhibits no discharge. No scleral icterus.  Neck: Neck supple. No JVD present. No tracheal deviation present. No thyromegaly present.  Cardiovascular: Normal rate, regular rhythm, normal heart sounds and intact distal pulses.  Exam reveals no gallop and no friction rub.   No murmur heard. EKG normal   Pulmonary/Chest: Effort normal and breath sounds normal. No respiratory distress. He has no wheezes. He has no rales. He exhibits no tenderness.  Abdominal: Soft. Bowel sounds are normal. He exhibits no distension and no mass. There is no tenderness. There is no rebound and no  guarding.  Genitourinary: Rectum normal, prostate normal and penis normal. Guaiac negative stool. No penile tenderness.  Several external hemorrhoids   Musculoskeletal: Normal range of motion. He exhibits no edema and no tenderness.  Lymphadenopathy:    He has no cervical adenopathy.  Neurological: He is alert and oriented to person, place, and time. He has normal reflexes. No cranial nerve deficit. He exhibits normal muscle tone. Coordination normal.  Skin: Skin is warm and dry. No rash noted. He is not diaphoretic. No erythema. No pallor.  Psychiatric: He has a normal mood and affect. His behavior is normal. Judgment and thought content normal.          Assessment & Plan:  Well exam. He needs to lose some weight. Get fasting labs

## 2013-06-29 NOTE — Progress Notes (Signed)
Pre visit review using our clinic review tool, if applicable. No additional management support is needed unless otherwise documented below in the visit note. 

## 2013-07-19 ENCOUNTER — Other Ambulatory Visit: Payer: Self-pay | Admitting: Family Medicine

## 2013-07-19 NOTE — Telephone Encounter (Signed)
Call in #90 with 2 rf 

## 2013-09-17 ENCOUNTER — Telehealth: Payer: Self-pay | Admitting: Family Medicine

## 2013-09-17 NOTE — Telephone Encounter (Signed)
Pt needs refill on generic torol-xl 50 mg #90 w/refills [facility] [place]. Pt has only one pill left

## 2013-09-20 MED ORDER — METOPROLOL SUCCINATE ER 50 MG PO TB24: 50.0000 mg | ORAL_TABLET | Freq: Every day | ORAL | Status: DC

## 2013-09-20 NOTE — Telephone Encounter (Signed)
I sent script e-scribe. 

## 2014-02-10 ENCOUNTER — Telehealth: Payer: Self-pay | Admitting: Family Medicine

## 2014-02-10 NOTE — Telephone Encounter (Signed)
[id number] - [place], [place] - [address] is requesting re-fill on ALPRAZolam (XANAX) 1 MG tablet

## 2014-02-11 MED ORDER — ALPRAZOLAM 1 MG PO TABS: ORAL_TABLET | ORAL | Status: DC

## 2014-02-11 NOTE — Telephone Encounter (Signed)
Needs contract and tox screen done dont see in record  Can refill x 1 Future refills per dr Clent RidgesFry

## 2014-02-11 NOTE — Telephone Encounter (Signed)
I called in script 

## 2014-03-02 ENCOUNTER — Other Ambulatory Visit: Payer: Self-pay | Admitting: Family Medicine

## 2014-03-02 ENCOUNTER — Other Ambulatory Visit: Payer: Self-pay | Admitting: Internal Medicine

## 2014-03-04 MED ORDER — ALPRAZOLAM 1 MG PO TABS: ORAL_TABLET | ORAL | Status: DC

## 2014-03-04 NOTE — Telephone Encounter (Signed)
I called in script 

## 2014-03-04 NOTE — Telephone Encounter (Signed)
Call in #[phone] with 5 rf 

## 2014-03-04 NOTE — Telephone Encounter (Signed)
done

## 2014-03-07 ENCOUNTER — Encounter: Payer: Self-pay | Admitting: Family Medicine

## 2014-03-07 ENCOUNTER — Ambulatory Visit (INDEPENDENT_AMBULATORY_CARE_PROVIDER_SITE_OTHER): Payer: PRIVATE HEALTH INSURANCE | Admitting: Family Medicine

## 2014-03-07 ENCOUNTER — Telehealth: Payer: Self-pay | Admitting: Family Medicine

## 2014-03-07 VITALS — BP 121/78 | HR 77 | Temp 98.2°F | Ht 70.0 in | Wt 247.0 lb

## 2014-03-07 DIAGNOSIS — W57XXXA Bitten or stung by nonvenomous insect and other nonvenomous arthropods, initial encounter: Principal | ICD-10-CM

## 2014-03-07 DIAGNOSIS — S30860A Insect bite (nonvenomous) of lower back and pelvis, initial encounter: Secondary | ICD-10-CM

## 2014-03-07 MED ORDER — DOXYCYCLINE HYCLATE 100 MG PO CAPS: 100.0000 mg | ORAL_CAPSULE | Freq: Two times a day (BID) | ORAL | Status: DC

## 2014-03-07 NOTE — Progress Notes (Signed)
   Subjective:    Patient ID: Dwayne Woodard, male    DOB: 1956/04/22, 58 y.o.   MRN: [id number]  HPI Here to check a tick bite on the right flank. He found a tick embedded here on 03-03-14 and pulled it out with tweezers. He feels fine but the spot has turned red.    Review of Systems  Constitutional: Negative.   Musculoskeletal: Negative.   Skin: Positive for rash.       Objective:   Physical Exam  Constitutional: He appears well-developed and well-nourished.  Skin:  Right flank has a small bite mark surrounded by an annular area of erythema, not tender           Assessment & Plan:  Local reaction to a tick bite, will cover with Doxycycline

## 2014-03-07 NOTE — Progress Notes (Signed)
Pre visit review using our clinic review tool, if applicable. No additional management support is needed unless otherwise documented below in the visit note. 

## 2014-03-07 NOTE — Telephone Encounter (Signed)
Relevant patient education mailed to patient.  

## 2014-04-08 ENCOUNTER — Encounter: Payer: Self-pay | Admitting: Gastroenterology

## 2014-06-30 ENCOUNTER — Encounter: Payer: PRIVATE HEALTH INSURANCE | Admitting: Family Medicine

## 2014-08-16 ENCOUNTER — Other Ambulatory Visit: Payer: Self-pay | Admitting: Family Medicine

## 2014-08-23 ENCOUNTER — Encounter: Payer: Self-pay | Admitting: Family Medicine

## 2014-08-23 ENCOUNTER — Ambulatory Visit (INDEPENDENT_AMBULATORY_CARE_PROVIDER_SITE_OTHER): Payer: PRIVATE HEALTH INSURANCE | Admitting: Family Medicine

## 2014-08-23 VITALS — BP 142/98 | HR 84 | Temp 98.1°F | Ht 70.0 in | Wt 248.0 lb

## 2014-08-23 DIAGNOSIS — Z0000 Encounter for general adult medical examination without abnormal findings: Secondary | ICD-10-CM

## 2014-08-23 LAB — BASIC METABOLIC PANEL
BUN: 24 mg/dL — AB (ref 6–23)
CHLORIDE: 107 meq/L (ref 96–112)
CO2: 30 meq/L (ref 19–32)
CREATININE: 1 mg/dL (ref 0.4–1.5)
Calcium: 9.6 mg/dL (ref 8.4–10.5)
GFR: 83.49 mL/min (ref >60.00)
GLUCOSE: 98 mg/dL (ref 70–99)
POTASSIUM: 5.5 meq/L — AB (ref 3.5–5.1)
Sodium: 143 mEq/L (ref 135–145)

## 2014-08-23 LAB — HEPATIC FUNCTION PANEL
ALBUMIN: 4.1 g/dL (ref 3.5–5.2)
ALK PHOS: 64 U/L (ref 39–117)
ALT: 27 U/L (ref 0–53)
AST: 26 U/L (ref 0–37)
BILIRUBIN DIRECT: 0.1 mg/dL (ref 0.0–0.3)
Total Bilirubin: 0.7 mg/dL (ref 0.2–1.2)
Total Protein: 7.3 g/dL (ref 6.0–8.3)

## 2014-08-23 LAB — CBC WITH DIFFERENTIAL/PLATELET
BASOS PCT: 0.5 % (ref 0.0–3.0)
Basophils Absolute: 0 10*3/uL (ref 0.0–0.1)
EOS PCT: 4.2 % (ref 0.0–5.0)
Eosinophils Absolute: 0.3 10*3/uL (ref 0.0–0.7)
HEMATOCRIT: 46.9 % (ref 39.0–52.0)
HEMOGLOBIN: 15.5 g/dL (ref 13.0–17.0)
Lymphocytes Relative: 38.9 % (ref 12.0–46.0)
Lymphs Abs: 2.8 10*3/uL (ref 0.7–4.0)
MCHC: 33.1 g/dL (ref 30.0–36.0)
MCV: 94 fl (ref 78.0–100.0)
Monocytes Absolute: 0.5 10*3/uL (ref 0.1–1.0)
Monocytes Relative: 7.1 % (ref 3.0–12.0)
Neutro Abs: 3.5 10*3/uL (ref 1.4–7.7)
Neutrophils Relative %: 49.3 % (ref 43.0–77.0)
PLATELETS: 225 10*3/uL (ref 150.0–400.0)
RBC: 4.99 Mil/uL (ref 4.22–5.81)
RDW: 13.1 % (ref 11.5–15.5)
WBC: 7.1 10*3/uL (ref 4.0–10.5)

## 2014-08-23 LAB — POCT URINALYSIS DIPSTICK
Bilirubin, UA: NEGATIVE
Glucose, UA: NEGATIVE
KETONES UA: NEGATIVE
LEUKOCYTES UA: NEGATIVE
Nitrite, UA: NEGATIVE
PH UA: 6.5
PROTEIN UA: NEGATIVE
RBC UA: NEGATIVE
Spec Grav, UA: 1.02
Urobilinogen, UA: 0.2

## 2014-08-23 LAB — LIPID PANEL
CHOL/HDL RATIO: 6
Cholesterol: 261 mg/dL — ABNORMAL HIGH (ref 0–200)
HDL: 45.9 mg/dL (ref >39.00)
NonHDL: 215.1
Triglycerides: 203 mg/dL — ABNORMAL HIGH (ref 0.0–149.0)
VLDL: 40.6 mg/dL — ABNORMAL HIGH (ref 0.0–40.0)

## 2014-08-23 LAB — LDL CHOLESTEROL, DIRECT: Direct LDL: 185.9 mg/dL

## 2014-08-23 LAB — TSH: TSH: 2.95 u[IU]/mL (ref 0.35–4.50)

## 2014-08-23 LAB — PSA: PSA: 1.08 ng/mL (ref 0.10–4.00)

## 2014-08-23 MED ORDER — VARENICLINE TARTRATE 1 MG PO TABS: 1.0000 mg | ORAL_TABLET | Freq: Two times a day (BID) | ORAL | Status: DC

## 2014-08-23 MED ORDER — METOPROLOL SUCCINATE ER 50 MG PO TB24: ORAL_TABLET | ORAL | Status: DC

## 2014-08-23 MED ORDER — ALPRAZOLAM 1 MG PO TABS: ORAL_TABLET | ORAL | Status: DC

## 2014-08-23 MED ORDER — VARENICLINE TARTRATE 0.5 MG X 11 & 1 MG X 42 PO MISC: ORAL | Status: DC

## 2014-08-23 NOTE — Progress Notes (Signed)
   Subjective:    Patient ID: Dwayne Woodard, male    DOB: 15-Nov-1955, 59 y.o.   MRN: [id number]  HPI 59 yr old male for a cpx. He feels well. He wants to try Chantix to help with quitting smoking.    Review of Systems  Constitutional: Negative.   HENT: Negative.   Eyes: Negative.   Respiratory: Negative.   Cardiovascular: Negative.   Gastrointestinal: Negative.   Genitourinary: Negative.   Musculoskeletal: Negative.   Skin: Negative.   Neurological: Negative.   Psychiatric/Behavioral: Negative.        Objective:   Physical Exam  Constitutional: He is oriented to person, place, and time. He appears well-developed and well-nourished. No distress.  HENT:  Head: Normocephalic and atraumatic.  Right Ear: External ear normal.  Left Ear: External ear normal.  Nose: Nose normal.  Mouth/Throat: Oropharynx is clear and moist. No oropharyngeal exudate.  Eyes: Conjunctivae and EOM are normal. Pupils are equal, round, and reactive to light. Right eye exhibits no discharge. Left eye exhibits no discharge. No scleral icterus.  Neck: Neck supple. No JVD present. No tracheal deviation present. No thyromegaly present.  Cardiovascular: Normal rate, regular rhythm, normal heart sounds and intact distal pulses.  Exam reveals no gallop and no friction rub.   No murmur heard. EKG normal   Pulmonary/Chest: Effort normal and breath sounds normal. No respiratory distress. He has no wheezes. He has no rales. He exhibits no tenderness.  Abdominal: Soft. Bowel sounds are normal. He exhibits no distension and no mass. There is no tenderness. There is no rebound and no guarding.  Genitourinary: Rectum normal, prostate normal and penis normal. Guaiac negative stool. No penile tenderness.  Musculoskeletal: Normal range of motion. He exhibits no edema or tenderness.  Lymphadenopathy:    He has no cervical adenopathy.  Neurological: He is alert and oriented to person, place, and time. He has normal  reflexes. No cranial nerve deficit. He exhibits normal muscle tone. Coordination normal.  Skin: Skin is warm and dry. No rash noted. He is not diaphoretic. No erythema. No pallor.  Psychiatric: He has a normal mood and affect. His behavior is normal. Judgment and thought content normal.          Assessment & Plan:  Well exam. Get fasting labs. Use Chantix for 90 days

## 2014-08-23 NOTE — Progress Notes (Signed)
Pre visit review using our clinic review tool, if applicable. No additional management support is needed unless otherwise documented below in the visit note. 

## 2014-09-02 MED ORDER — ATORVASTATIN CALCIUM 20 MG PO TABS: 20.0000 mg | ORAL_TABLET | Freq: Every day | ORAL | Status: DC

## 2014-09-02 NOTE — Addendum Note (Signed)
Addended by: Aniceto BossNIMMONS, Guhan Bruington A on: 09/02/2014 04:13 PM   Modules accepted: Orders

## 2014-09-24 ENCOUNTER — Other Ambulatory Visit: Payer: Self-pay | Admitting: Family Medicine

## 2014-09-28 ENCOUNTER — Telehealth: Payer: Self-pay | Admitting: Family Medicine

## 2014-09-28 NOTE — Telephone Encounter (Signed)
Pt request refill of the following: ALPRAZolam (XANAX) 1 MG tablet   PT HAS 5 REFILLS  THE PHARMACY TOLD PT WIFE THEY WE HAD TO CONTACT THEM ABOUT A  REFILL    Phamacy: [facility] [place]

## 2014-09-29 NOTE — Telephone Encounter (Signed)
I called pharmacy and left a voice message, pt should have refills left.

## 2014-10-22 ENCOUNTER — Other Ambulatory Visit: Payer: Self-pay | Admitting: Family Medicine

## 2014-11-25 ENCOUNTER — Encounter: Payer: Self-pay | Admitting: Family Medicine

## 2014-11-25 ENCOUNTER — Ambulatory Visit (INDEPENDENT_AMBULATORY_CARE_PROVIDER_SITE_OTHER): Payer: PRIVATE HEALTH INSURANCE | Admitting: Family Medicine

## 2014-11-25 VITALS — BP 111/66 | HR 74 | Temp 98.4°F | Ht 70.0 in | Wt 234.0 lb

## 2014-11-25 DIAGNOSIS — I10 Essential (primary) hypertension: Secondary | ICD-10-CM | POA: Diagnosis not present

## 2014-11-25 DIAGNOSIS — R42 Dizziness and giddiness: Secondary | ICD-10-CM | POA: Diagnosis not present

## 2014-11-25 LAB — BASIC METABOLIC PANEL
BUN: 16 mg/dL (ref 6–23)
CALCIUM: 9.6 mg/dL (ref 8.4–10.5)
CHLORIDE: 104 meq/L (ref 96–112)
CO2: 30 mEq/L (ref 19–32)
Creatinine, Ser: 1 mg/dL (ref 0.40–1.50)
GFR: 81.49 mL/min (ref >60.00)
Glucose, Bld: 91 mg/dL (ref 70–99)
Potassium: 5.3 mEq/L — ABNORMAL HIGH (ref 3.5–5.1)
Sodium: 138 mEq/L (ref 135–145)

## 2014-11-25 MED ORDER — METOPROLOL SUCCINATE ER 25 MG PO TB24: 25.0000 mg | ORAL_TABLET | Freq: Every day | ORAL | Status: DC

## 2014-11-25 NOTE — Progress Notes (Signed)
   Subjective:    Patient ID: Dwayne HonesRobert S Bouler, male    DOB: October 28, 1955, 59 y.o.   MRN: [id number]  HPI Here for several weeks of intermittent lightheadedness and feeling chills, which give him goose bumps on his forearms. No HA or SOB or chest sx. He was started on Lipitor and Chantix last January, and he has taken these until he stopped the Chantix 2 weeks ago. He was having vivid dreams at night which upset him, and these still persist. He has changed his diet and has lost 10 lbs. He tends to have these sx in the mornings but they go away by the afternoons.    Review of Systems  Constitutional: Negative.   HENT: Negative.   Respiratory: Negative.   Cardiovascular: Negative.   Endocrine: Negative.   Neurological: Positive for light-headedness. Negative for dizziness, weakness and headaches.       Objective:   Physical Exam  Constitutional: He is oriented to person, place, and time. He appears well-developed and well-nourished.  Neck: No thyromegaly present.  Cardiovascular: Normal rate, regular rhythm, normal heart sounds and intact distal pulses.   Pulmonary/Chest: Effort normal and breath sounds normal.  Lymphadenopathy:    He has no cervical adenopathy.  Neurological: He is alert and oriented to person, place, and time. He has normal reflexes. No cranial nerve deficit. He exhibits normal muscle tone. Coordination normal.          Assessment & Plan:  It is difficult to tell where these sx are coming from. I agree with stopping the Chantix, and I told him to stop the Lipitor as well. He may be having hypotensive spells so we will decrease the Metoprolol to 25 mg daily. Check a BMET today.

## 2014-11-25 NOTE — Progress Notes (Signed)
Pre visit review using our clinic review tool, if applicable. No additional management support is needed unless otherwise documented below in the visit note. 

## 2015-03-23 ENCOUNTER — Other Ambulatory Visit: Payer: Self-pay | Admitting: Family Medicine

## 2015-03-24 NOTE — Telephone Encounter (Signed)
Pt was last seen here on 11/25/14 and refill is due.

## 2015-03-24 NOTE — Telephone Encounter (Signed)
Ok to refill 

## 2015-04-19 ENCOUNTER — Other Ambulatory Visit: Payer: Self-pay | Admitting: Family Medicine

## 2015-04-19 NOTE — Telephone Encounter (Signed)
Pt request refill of the following: ALPRAZolam (XANAX) 1 MG tablet  Pt said he know this request is a little early but he does not want to run out     Phamacy:

## 2015-04-20 MED ORDER — ALPRAZOLAM 1 MG PO TABS: 1.0000 mg | ORAL_TABLET | Freq: Three times a day (TID) | ORAL | Status: DC | PRN

## 2015-04-20 NOTE — Telephone Encounter (Signed)
Rx called in to pharmacy. 

## 2015-04-20 NOTE — Telephone Encounter (Signed)
Call in #[phone] with 5 rf 

## 2015-06-19 ENCOUNTER — Other Ambulatory Visit: Payer: Self-pay | Admitting: Family Medicine

## 2015-06-21 ENCOUNTER — Encounter: Payer: Self-pay | Admitting: Family Medicine

## 2015-06-21 ENCOUNTER — Ambulatory Visit (INDEPENDENT_AMBULATORY_CARE_PROVIDER_SITE_OTHER): Payer: PRIVATE HEALTH INSURANCE | Admitting: Family Medicine

## 2015-06-21 VITALS — BP 159/88 | HR 62 | Temp 98.2°F | Ht 70.0 in | Wt 231.0 lb

## 2015-06-21 DIAGNOSIS — J209 Acute bronchitis, unspecified: Secondary | ICD-10-CM

## 2015-06-21 MED ORDER — LEVOFLOXACIN 500 MG PO TABS: 500.0000 mg | ORAL_TABLET | Freq: Every day | ORAL | Status: AC

## 2015-06-21 NOTE — Progress Notes (Signed)
   Subjective:    Patient ID: Dwayne Woodard, male    DOB: [birth year], 59 y.o.   MRN: [id number]  HPI Here for 3 days of chest congestion and coughing up green sputum. No fever.    Review of Systems  Constitutional: Negative.   HENT: Positive for congestion and postnasal drip. Negative for ear pain.   Eyes: Negative.   Respiratory: Positive for cough. Negative for shortness of breath and wheezing.   Cardiovascular: Negative.        Objective:   Physical Exam  Constitutional: He appears well-developed and well-nourished. No distress.  HENT:  Right Ear: External ear normal.  Left Ear: External ear normal.  Nose: Nose normal.  Mouth/Throat: Oropharynx is clear and moist.  Eyes: Conjunctivae are normal.  Cardiovascular: Normal rate, regular rhythm, normal heart sounds and intact distal pulses.   Pulmonary/Chest: Effort normal. No respiratory distress. He has no wheezes. He has no rales.  Scattered rhonchi   Lymphadenopathy:    He has no cervical adenopathy.          Assessment & Plan:  Bronchitis, treat with Levaquin and Mucinex. Written out of work from 06-19-15 until 06-26-15.

## 2015-06-21 NOTE — Progress Notes (Signed)
Pre visit review using our clinic review tool, if applicable. No additional management support is needed unless otherwise documented below in the visit note. 

## 2015-09-25 ENCOUNTER — Telehealth: Payer: Self-pay | Admitting: Family Medicine

## 2015-09-25 MED ORDER — LEVOFLOXACIN 500 MG PO TABS: 500.0000 mg | ORAL_TABLET | Freq: Every day | ORAL | Status: DC

## 2015-09-25 NOTE — Telephone Encounter (Signed)
I sent script e-scribe and spoke with pt. 

## 2015-09-25 NOTE — Telephone Encounter (Signed)
Call in Levaquin 500 mg daily for 10 days  

## 2015-09-25 NOTE — Telephone Encounter (Signed)
Pt would like a rx of Levaquin called in for him. Having sinus issues with green drainage and cough. He was advised that he may need to be seen but he asked that we send a phone note first as he is working today.

## 2015-09-28 ENCOUNTER — Telehealth: Payer: Self-pay | Admitting: Family Medicine

## 2015-09-28 NOTE — Telephone Encounter (Signed)
Note is ready and I spoke with pt.  

## 2015-09-28 NOTE — Telephone Encounter (Signed)
The note is ready

## 2015-09-28 NOTE — Telephone Encounter (Signed)
Pt was prescribed levofloxacin (LEVAQUIN) 500 MG tablet  For poss bronchitis on Tuesday by dr Clent Ridges. 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  Pt needs a dr note stating that pt has been out of work due to illness and can return to work on Monday 10/02/15.  Pt would like to pick up note when ready.

## 2015-10-24 ENCOUNTER — Other Ambulatory Visit: Payer: Self-pay | Admitting: Family Medicine

## 2015-10-24 NOTE — Telephone Encounter (Signed)
Call in #[phone] with 5 rf 

## 2015-11-22 ENCOUNTER — Other Ambulatory Visit: Payer: Self-pay | Admitting: Family Medicine

## 2016-04-19 ENCOUNTER — Other Ambulatory Visit: Payer: Self-pay | Admitting: Family Medicine

## 2016-04-23 NOTE — Telephone Encounter (Signed)
Call in #[phone] with 5 rf 

## 2016-09-30 DIAGNOSIS — H527 Unspecified disorder of refraction: Secondary | ICD-10-CM | POA: Diagnosis not present

## 2016-10-01 DIAGNOSIS — K5909 Other constipation: Secondary | ICD-10-CM | POA: Diagnosis not present

## 2016-10-01 DIAGNOSIS — R634 Abnormal weight loss: Secondary | ICD-10-CM | POA: Diagnosis not present

## 2016-10-07 DIAGNOSIS — R05 Cough: Secondary | ICD-10-CM | POA: Diagnosis not present

## 2016-10-07 DIAGNOSIS — J029 Acute pharyngitis, unspecified: Secondary | ICD-10-CM | POA: Diagnosis not present

## 2016-10-16 DIAGNOSIS — R351 Nocturia: Secondary | ICD-10-CM | POA: Diagnosis not present

## 2016-10-16 DIAGNOSIS — N401 Enlarged prostate with lower urinary tract symptoms: Secondary | ICD-10-CM | POA: Diagnosis not present

## 2016-10-16 DIAGNOSIS — K5909 Other constipation: Secondary | ICD-10-CM | POA: Diagnosis not present

## 2016-10-16 DIAGNOSIS — R634 Abnormal weight loss: Secondary | ICD-10-CM | POA: Diagnosis not present

## 2016-10-16 DIAGNOSIS — G20 Parkinson's disease: Secondary | ICD-10-CM | POA: Diagnosis not present

## 2016-10-16 DIAGNOSIS — R05 Cough: Secondary | ICD-10-CM | POA: Diagnosis not present

## 2016-10-19 ENCOUNTER — Other Ambulatory Visit: Payer: Self-pay | Admitting: Family Medicine

## 2016-10-21 ENCOUNTER — Telehealth: Payer: Self-pay | Admitting: Family Medicine

## 2016-10-21 NOTE — Telephone Encounter (Signed)
Pt needs refill on metoprolol succinate 25 mg #30 w/refills [facility]

## 2016-10-22 NOTE — Telephone Encounter (Signed)
Call in #[phone] with 5 rf 

## 2016-10-23 NOTE — Telephone Encounter (Signed)
Can you call pt to schedule a office visit? He can schedule for physical and fast, if he would like.

## 2016-10-23 NOTE — Telephone Encounter (Signed)
The Xanax has been refilled

## 2016-10-23 NOTE — Telephone Encounter (Signed)
Pt needs a office visit, he should have refills until 11/22/2016.

## 2016-10-23 NOTE — Telephone Encounter (Signed)
Pt need new Rx for alprazolam  Pharm:  [facility] pt state that he is out and would like to see if he could get it today along with is Bp medication.  Pt is aware of the office visit that is needed and will call back when he gets his calendar to schedule it.

## 2016-11-27 DIAGNOSIS — M79672 Pain in left foot: Secondary | ICD-10-CM | POA: Diagnosis not present

## 2016-11-27 DIAGNOSIS — M79671 Pain in right foot: Secondary | ICD-10-CM | POA: Diagnosis not present

## 2016-11-27 DIAGNOSIS — B351 Tinea unguium: Secondary | ICD-10-CM | POA: Diagnosis not present

## 2016-12-09 DIAGNOSIS — K5909 Other constipation: Secondary | ICD-10-CM | POA: Diagnosis not present

## 2016-12-09 DIAGNOSIS — N401 Enlarged prostate with lower urinary tract symptoms: Secondary | ICD-10-CM | POA: Diagnosis not present

## 2016-12-09 DIAGNOSIS — R634 Abnormal weight loss: Secondary | ICD-10-CM | POA: Diagnosis not present

## 2016-12-09 DIAGNOSIS — D649 Anemia, unspecified: Secondary | ICD-10-CM | POA: Diagnosis not present

## 2016-12-09 DIAGNOSIS — R351 Nocturia: Secondary | ICD-10-CM | POA: Diagnosis not present

## 2016-12-09 DIAGNOSIS — G20 Parkinson's disease: Secondary | ICD-10-CM | POA: Diagnosis not present

## 2016-12-14 ENCOUNTER — Other Ambulatory Visit: Payer: Self-pay | Admitting: Family Medicine

## 2016-12-20 ENCOUNTER — Encounter: Payer: Self-pay | Admitting: Family Medicine

## 2016-12-20 ENCOUNTER — Ambulatory Visit (INDEPENDENT_AMBULATORY_CARE_PROVIDER_SITE_OTHER): Payer: PRIVATE HEALTH INSURANCE | Admitting: Family Medicine

## 2016-12-20 VITALS — BP 128/86 | HR 96 | Temp 98.2°F | Wt 206.8 lb

## 2016-12-20 DIAGNOSIS — Z0000 Encounter for general adult medical examination without abnormal findings: Secondary | ICD-10-CM | POA: Diagnosis not present

## 2016-12-20 LAB — CBC WITH DIFFERENTIAL/PLATELET
BASOS PCT: 0.6 % (ref 0.0–3.0)
Basophils Absolute: 0 10*3/uL (ref 0.0–0.1)
EOS PCT: 1.8 % (ref 0.0–5.0)
Eosinophils Absolute: 0.1 10*3/uL (ref 0.0–0.7)
HCT: 51.2 % (ref 39.0–52.0)
Hemoglobin: 17.1 g/dL — ABNORMAL HIGH (ref 13.0–17.0)
Lymphocytes Relative: 27.1 % (ref 12.0–46.0)
Lymphs Abs: 1.9 10*3/uL (ref 0.7–4.0)
MCHC: 33.4 g/dL (ref 30.0–36.0)
MCV: 96.2 fl (ref 78.0–100.0)
MONOS PCT: 7.6 % (ref 3.0–12.0)
Monocytes Absolute: 0.5 10*3/uL (ref 0.1–1.0)
NEUTROS ABS: 4.5 10*3/uL (ref 1.4–7.7)
NEUTROS PCT: 62.9 % (ref 43.0–77.0)
Platelets: 254 10*3/uL (ref 150.0–400.0)
RBC: 5.32 Mil/uL (ref 4.22–5.81)
RDW: 13.2 % (ref 11.5–15.5)
WBC: 7.2 10*3/uL (ref 4.0–10.5)

## 2016-12-20 LAB — LIPID PANEL
Cholesterol: 263 mg/dL — ABNORMAL HIGH (ref 0–200)
HDL: 46.7 mg/dL (ref >39.00)
NONHDL: 216.74
Total CHOL/HDL Ratio: 6
Triglycerides: 250 mg/dL — ABNORMAL HIGH (ref 0.0–149.0)
VLDL: 50 mg/dL — ABNORMAL HIGH (ref 0.0–40.0)

## 2016-12-20 LAB — HEPATIC FUNCTION PANEL
ALK PHOS: 75 U/L (ref 39–117)
ALT: 19 U/L (ref 0–53)
AST: 23 U/L (ref 0–37)
Albumin: 4.5 g/dL (ref 3.5–5.2)
BILIRUBIN DIRECT: 0.1 mg/dL (ref 0.0–0.3)
BILIRUBIN TOTAL: 0.7 mg/dL (ref 0.2–1.2)
Total Protein: 7.7 g/dL (ref 6.0–8.3)

## 2016-12-20 LAB — TSH: TSH: 3.69 u[IU]/mL (ref 0.35–4.50)

## 2016-12-20 LAB — POC URINALSYSI DIPSTICK (AUTOMATED)
BILIRUBIN UA: NEGATIVE
GLUCOSE UA: NEGATIVE
KETONES UA: NEGATIVE
LEUKOCYTES UA: NEGATIVE
Nitrite, UA: NEGATIVE
PROTEIN UA: NEGATIVE
Spec Grav, UA: 1.015 (ref 1.010–1.025)
Urobilinogen, UA: 1 E.U./dL
pH, UA: 6 (ref 5.0–8.0)

## 2016-12-20 LAB — BASIC METABOLIC PANEL
BUN: 13 mg/dL (ref 6–23)
CHLORIDE: 103 meq/L (ref 96–112)
CO2: 29 mEq/L (ref 19–32)
Calcium: 10 mg/dL (ref 8.4–10.5)
Creatinine, Ser: 1.02 mg/dL (ref 0.40–1.50)
GFR: 79.09 mL/min (ref >60.00)
GLUCOSE: 100 mg/dL — AB (ref 70–99)
Potassium: 5.2 mEq/L — ABNORMAL HIGH (ref 3.5–5.1)
SODIUM: 140 meq/L (ref 135–145)

## 2016-12-20 LAB — PSA: PSA: 2.97 ng/mL (ref 0.10–4.00)

## 2016-12-20 LAB — LDL CHOLESTEROL, DIRECT: Direct LDL: 180 mg/dL

## 2016-12-20 MED ORDER — DICLOFENAC SODIUM 75 MG PO TBEC: 75.0000 mg | DELAYED_RELEASE_TABLET | Freq: Two times a day (BID) | ORAL | 2 refills | Status: DC

## 2016-12-20 MED ORDER — METHOCARBAMOL 500 MG PO TABS: 500.0000 mg | ORAL_TABLET | Freq: Four times a day (QID) | ORAL | 2 refills | Status: DC | PRN

## 2016-12-20 MED ORDER — METOPROLOL SUCCINATE ER 25 MG PO TB24: ORAL_TABLET | ORAL | 3 refills | Status: DC

## 2016-12-20 NOTE — Progress Notes (Signed)
   Subjective:    Patient ID: Dwayne Woodard, male    DOB: 01/18/1956, 61 y.o.   MRN: [id number]  HPI 61 yr old male for a well exam. He has been doing well except for a recent back injury. While on the job on 11-24-16 he was pulling out an [facility] when he felt a pain in the lower back. Over the next 24 hours the pain worsened and the muscles tightened up. He used Ibuprofen and heat, and after a few days the spasms settled down. Then a few days ago while he was mowing some grass he felt the lower back tighten up again. He went to an Urgent Care clinic [facility] on 12-18-16 and he was given a shot of Toradol and a shot of Solumedrol. He was also given Flexeril and Naproxen. He still has stiffness and pain in the lower back, and he has not been able to work  This week. No pain in the legs, no numbness or weakness in the legs.    Review of Systems  Constitutional: Negative.   HENT: Negative.   Eyes: Negative.   Respiratory: Negative.   Cardiovascular: Negative.   Gastrointestinal: Negative.   Genitourinary: Negative.   Musculoskeletal: Positive for back pain. Negative for arthralgias, gait problem, joint swelling, myalgias, neck pain and neck stiffness.  Skin: Negative.   Neurological: Negative.   Psychiatric/Behavioral: Negative.        Objective:   Physical Exam  Constitutional: He is oriented to person, place, and time. He appears well-developed and well-nourished. No distress.  HENT:  Head: Normocephalic and atraumatic.  Right Ear: External ear normal.  Left Ear: External ear normal.  Nose: Nose normal.  Mouth/Throat: Oropharynx is clear and moist. No oropharyngeal exudate.  Eyes: Conjunctivae and EOM are normal. Pupils are equal, round, and reactive to light. Right eye exhibits no discharge. Left eye exhibits no discharge. No scleral icterus.  Neck: Neck supple. No JVD present. No tracheal deviation present. No thyromegaly present.  Cardiovascular: Normal rate,  regular rhythm, normal heart sounds and intact distal pulses.  Exam reveals no gallop and no friction rub.   No murmur heard. Pulmonary/Chest: Effort normal and breath sounds normal. No respiratory distress. He has no wheezes. He has no rales. He exhibits no tenderness.  Abdominal: Soft. Bowel sounds are normal. He exhibits no distension and no mass. There is no tenderness. There is no rebound and no guarding.  Genitourinary: Rectum normal, prostate normal and penis normal. Rectal exam shows guaiac negative stool. No penile tenderness.  Musculoskeletal: Normal range of motion. He exhibits no edema.  He has some spasms and some tenderness in the lower back, but ROM is full and SLR are negative  Lymphadenopathy:    He has no cervical adenopathy.  Neurological: He is alert and oriented to person, place, and time. He has normal reflexes. No cranial nerve deficit. He exhibits normal muscle tone. Coordination normal.  Skin: Skin is warm and dry. No rash noted. He is not diaphoretic. No erythema. No pallor.  Psychiatric: He has a normal mood and affect. His behavior is normal. Judgment and thought content normal.          Assessment & Plan:  Well exam. We discussed diet and exercise. For the back pain, we will switch him to Diclofenac and Robaxin. He is written out of work today until 12-30-16. Get fasting labs.  Gershon CraneStephen Rikayla Demmon, MD

## 2016-12-20 NOTE — Patient Instructions (Signed)
WE NOW OFFER   [facility] FAST TRACK!!!  SAME DAY Appointments for ACUTE CARE  Such as: Sprains, Injuries, cuts, abrasions, rashes, muscle pain, joint pain, back pain Colds, flu, sore throats, headache, allergies, cough, fever  Ear pain, sinus and eye infections Abdominal pain, nausea, vomiting, diarrhea, upset stomach Animal/insect bites  3 Easy Ways to Schedule: Walk-In Scheduling Call in scheduling Mychart Sign-up: [email or web address]         

## 2016-12-20 NOTE — Progress Notes (Signed)
Pre visit review using our clinic review tool, if applicable. No additional management support is needed unless otherwise documented below in the visit note. 

## 2016-12-25 DIAGNOSIS — Z961 Presence of intraocular lens: Secondary | ICD-10-CM | POA: Diagnosis not present

## 2016-12-25 DIAGNOSIS — H348112 Central retinal vein occlusion, right eye, stable: Secondary | ICD-10-CM | POA: Diagnosis not present

## 2016-12-25 DIAGNOSIS — H35371 Puckering of macula, right eye: Secondary | ICD-10-CM | POA: Diagnosis not present

## 2016-12-25 DIAGNOSIS — H353132 Nonexudative age-related macular degeneration, bilateral, intermediate dry stage: Secondary | ICD-10-CM | POA: Diagnosis not present

## 2016-12-26 ENCOUNTER — Encounter: Payer: Self-pay | Admitting: Family Medicine

## 2017-01-03 DIAGNOSIS — R351 Nocturia: Secondary | ICD-10-CM | POA: Diagnosis not present

## 2017-01-03 DIAGNOSIS — K5900 Constipation, unspecified: Secondary | ICD-10-CM | POA: Diagnosis not present

## 2017-01-03 DIAGNOSIS — K529 Noninfective gastroenteritis and colitis, unspecified: Secondary | ICD-10-CM | POA: Diagnosis not present

## 2017-01-03 DIAGNOSIS — K5909 Other constipation: Secondary | ICD-10-CM | POA: Diagnosis not present

## 2017-01-03 DIAGNOSIS — N401 Enlarged prostate with lower urinary tract symptoms: Secondary | ICD-10-CM | POA: Diagnosis not present

## 2017-01-03 DIAGNOSIS — R1084 Generalized abdominal pain: Secondary | ICD-10-CM | POA: Diagnosis not present

## 2017-01-03 DIAGNOSIS — G20 Parkinson's disease: Secondary | ICD-10-CM | POA: Diagnosis not present

## 2017-01-03 DIAGNOSIS — J439 Emphysema, unspecified: Secondary | ICD-10-CM | POA: Diagnosis not present

## 2017-01-07 DIAGNOSIS — G20 Parkinson's disease: Secondary | ICD-10-CM | POA: Diagnosis not present

## 2017-01-07 DIAGNOSIS — R634 Abnormal weight loss: Secondary | ICD-10-CM | POA: Diagnosis not present

## 2017-01-07 DIAGNOSIS — N401 Enlarged prostate with lower urinary tract symptoms: Secondary | ICD-10-CM | POA: Diagnosis not present

## 2017-01-07 DIAGNOSIS — R351 Nocturia: Secondary | ICD-10-CM | POA: Diagnosis not present

## 2017-01-07 DIAGNOSIS — K5909 Other constipation: Secondary | ICD-10-CM | POA: Diagnosis not present

## 2017-01-13 DIAGNOSIS — K5909 Other constipation: Secondary | ICD-10-CM | POA: Diagnosis not present

## 2017-01-24 DIAGNOSIS — G20 Parkinson's disease: Secondary | ICD-10-CM | POA: Diagnosis not present

## 2017-01-24 DIAGNOSIS — K5909 Other constipation: Secondary | ICD-10-CM | POA: Diagnosis not present

## 2017-01-24 DIAGNOSIS — N401 Enlarged prostate with lower urinary tract symptoms: Secondary | ICD-10-CM | POA: Diagnosis not present

## 2017-01-24 DIAGNOSIS — J309 Allergic rhinitis, unspecified: Secondary | ICD-10-CM | POA: Diagnosis not present

## 2017-01-27 DIAGNOSIS — N281 Cyst of kidney, acquired: Secondary | ICD-10-CM | POA: Diagnosis not present

## 2017-01-27 DIAGNOSIS — I10 Essential (primary) hypertension: Secondary | ICD-10-CM | POA: Diagnosis not present

## 2017-01-27 DIAGNOSIS — Z79899 Other long term (current) drug therapy: Secondary | ICD-10-CM | POA: Diagnosis not present

## 2017-01-27 DIAGNOSIS — K5900 Constipation, unspecified: Secondary | ICD-10-CM | POA: Diagnosis not present

## 2017-01-27 DIAGNOSIS — R109 Unspecified abdominal pain: Secondary | ICD-10-CM | POA: Diagnosis not present

## 2017-01-27 DIAGNOSIS — E785 Hyperlipidemia, unspecified: Secondary | ICD-10-CM | POA: Diagnosis not present

## 2017-01-27 DIAGNOSIS — K5730 Diverticulosis of large intestine without perforation or abscess without bleeding: Secondary | ICD-10-CM | POA: Diagnosis not present

## 2017-01-27 DIAGNOSIS — R338 Other retention of urine: Secondary | ICD-10-CM | POA: Diagnosis not present

## 2017-01-27 DIAGNOSIS — R339 Retention of urine, unspecified: Secondary | ICD-10-CM | POA: Diagnosis not present

## 2017-01-27 DIAGNOSIS — G20 Parkinson's disease: Secondary | ICD-10-CM | POA: Diagnosis not present

## 2017-01-27 DIAGNOSIS — Z87891 Personal history of nicotine dependence: Secondary | ICD-10-CM | POA: Diagnosis not present

## 2017-01-27 DIAGNOSIS — M1990 Unspecified osteoarthritis, unspecified site: Secondary | ICD-10-CM | POA: Diagnosis not present

## 2017-01-27 DIAGNOSIS — Z7951 Long term (current) use of inhaled steroids: Secondary | ICD-10-CM | POA: Diagnosis not present

## 2017-01-29 DIAGNOSIS — Z79899 Other long term (current) drug therapy: Secondary | ICD-10-CM | POA: Diagnosis not present

## 2017-01-29 DIAGNOSIS — Z7982 Long term (current) use of aspirin: Secondary | ICD-10-CM | POA: Diagnosis not present

## 2017-01-29 DIAGNOSIS — Z87891 Personal history of nicotine dependence: Secondary | ICD-10-CM | POA: Diagnosis not present

## 2017-01-29 DIAGNOSIS — Z466 Encounter for fitting and adjustment of urinary device: Secondary | ICD-10-CM | POA: Diagnosis not present

## 2017-01-29 DIAGNOSIS — Z008 Encounter for other general examination: Secondary | ICD-10-CM | POA: Diagnosis not present

## 2017-01-29 DIAGNOSIS — G20 Parkinson's disease: Secondary | ICD-10-CM | POA: Diagnosis not present

## 2017-01-29 DIAGNOSIS — Z7951 Long term (current) use of inhaled steroids: Secondary | ICD-10-CM | POA: Diagnosis not present

## 2017-01-29 DIAGNOSIS — I10 Essential (primary) hypertension: Secondary | ICD-10-CM | POA: Diagnosis not present

## 2017-01-29 DIAGNOSIS — E785 Hyperlipidemia, unspecified: Secondary | ICD-10-CM | POA: Diagnosis not present

## 2017-01-29 DIAGNOSIS — M1990 Unspecified osteoarthritis, unspecified site: Secondary | ICD-10-CM | POA: Diagnosis not present

## 2017-02-04 DIAGNOSIS — R339 Retention of urine, unspecified: Secondary | ICD-10-CM | POA: Diagnosis not present

## 2017-02-11 DIAGNOSIS — R339 Retention of urine, unspecified: Secondary | ICD-10-CM | POA: Diagnosis not present

## 2017-02-12 DIAGNOSIS — I739 Peripheral vascular disease, unspecified: Secondary | ICD-10-CM | POA: Diagnosis not present

## 2017-02-12 DIAGNOSIS — L603 Nail dystrophy: Secondary | ICD-10-CM | POA: Diagnosis not present

## 2017-02-23 DIAGNOSIS — G20 Parkinson's disease: Secondary | ICD-10-CM | POA: Diagnosis not present

## 2017-02-27 DIAGNOSIS — N401 Enlarged prostate with lower urinary tract symptoms: Secondary | ICD-10-CM | POA: Diagnosis not present

## 2017-02-27 DIAGNOSIS — R634 Abnormal weight loss: Secondary | ICD-10-CM | POA: Diagnosis not present

## 2017-02-27 DIAGNOSIS — D649 Anemia, unspecified: Secondary | ICD-10-CM | POA: Diagnosis not present

## 2017-02-27 DIAGNOSIS — E785 Hyperlipidemia, unspecified: Secondary | ICD-10-CM | POA: Diagnosis not present

## 2017-02-27 DIAGNOSIS — G20 Parkinson's disease: Secondary | ICD-10-CM | POA: Diagnosis not present

## 2017-02-27 DIAGNOSIS — R351 Nocturia: Secondary | ICD-10-CM | POA: Diagnosis not present

## 2017-02-27 DIAGNOSIS — Z79899 Other long term (current) drug therapy: Secondary | ICD-10-CM | POA: Diagnosis not present

## 2017-02-27 DIAGNOSIS — K5909 Other constipation: Secondary | ICD-10-CM | POA: Diagnosis not present

## 2017-02-27 DIAGNOSIS — R339 Retention of urine, unspecified: Secondary | ICD-10-CM | POA: Diagnosis not present

## 2017-03-12 DIAGNOSIS — H6120 Impacted cerumen, unspecified ear: Secondary | ICD-10-CM | POA: Diagnosis not present

## 2017-03-12 DIAGNOSIS — J3089 Other allergic rhinitis: Secondary | ICD-10-CM | POA: Diagnosis not present

## 2017-03-19 DIAGNOSIS — J309 Allergic rhinitis, unspecified: Secondary | ICD-10-CM | POA: Diagnosis not present

## 2017-03-19 DIAGNOSIS — T887XXA Unspecified adverse effect of drug or medicament, initial encounter: Secondary | ICD-10-CM | POA: Diagnosis not present

## 2017-04-11 DIAGNOSIS — G20 Parkinson's disease: Secondary | ICD-10-CM | POA: Diagnosis not present

## 2017-04-11 DIAGNOSIS — K5909 Other constipation: Secondary | ICD-10-CM | POA: Diagnosis not present

## 2017-04-11 DIAGNOSIS — R634 Abnormal weight loss: Secondary | ICD-10-CM | POA: Diagnosis not present

## 2017-04-11 DIAGNOSIS — R079 Chest pain, unspecified: Secondary | ICD-10-CM | POA: Diagnosis not present

## 2017-04-11 DIAGNOSIS — R351 Nocturia: Secondary | ICD-10-CM | POA: Diagnosis not present

## 2017-04-11 DIAGNOSIS — N401 Enlarged prostate with lower urinary tract symptoms: Secondary | ICD-10-CM | POA: Diagnosis not present

## 2017-04-23 ENCOUNTER — Other Ambulatory Visit: Payer: Self-pay | Admitting: Family Medicine

## 2017-04-23 NOTE — Telephone Encounter (Signed)
Call in #[phone] with 5 rf 

## 2017-04-29 DIAGNOSIS — J329 Chronic sinusitis, unspecified: Secondary | ICD-10-CM | POA: Diagnosis not present

## 2017-04-29 DIAGNOSIS — K5909 Other constipation: Secondary | ICD-10-CM | POA: Diagnosis not present

## 2017-04-29 DIAGNOSIS — Z23 Encounter for immunization: Secondary | ICD-10-CM | POA: Diagnosis not present

## 2017-05-22 DIAGNOSIS — G20 Parkinson's disease: Secondary | ICD-10-CM | POA: Diagnosis not present

## 2017-06-05 DIAGNOSIS — G20 Parkinson's disease: Secondary | ICD-10-CM | POA: Diagnosis not present

## 2017-06-12 DIAGNOSIS — R634 Abnormal weight loss: Secondary | ICD-10-CM | POA: Diagnosis not present

## 2017-06-12 DIAGNOSIS — R351 Nocturia: Secondary | ICD-10-CM | POA: Diagnosis not present

## 2017-06-12 DIAGNOSIS — R040 Epistaxis: Secondary | ICD-10-CM | POA: Diagnosis not present

## 2017-06-12 DIAGNOSIS — G20 Parkinson's disease: Secondary | ICD-10-CM | POA: Diagnosis not present

## 2017-06-12 DIAGNOSIS — K5909 Other constipation: Secondary | ICD-10-CM | POA: Diagnosis not present

## 2017-06-12 DIAGNOSIS — N401 Enlarged prostate with lower urinary tract symptoms: Secondary | ICD-10-CM | POA: Diagnosis not present

## 2017-07-06 DIAGNOSIS — R197 Diarrhea, unspecified: Secondary | ICD-10-CM | POA: Diagnosis not present

## 2017-08-27 DIAGNOSIS — H35371 Puckering of macula, right eye: Secondary | ICD-10-CM | POA: Diagnosis not present

## 2017-08-27 DIAGNOSIS — H353132 Nonexudative age-related macular degeneration, bilateral, intermediate dry stage: Secondary | ICD-10-CM | POA: Diagnosis not present

## 2017-08-27 DIAGNOSIS — H4052X3 Glaucoma secondary to other eye disorders, left eye, severe stage: Secondary | ICD-10-CM | POA: Diagnosis not present

## 2017-08-27 DIAGNOSIS — H34812 Central retinal vein occlusion, left eye, with macular edema: Secondary | ICD-10-CM | POA: Diagnosis not present

## 2017-08-29 DIAGNOSIS — R197 Diarrhea, unspecified: Secondary | ICD-10-CM | POA: Diagnosis not present

## 2017-09-04 ENCOUNTER — Encounter: Payer: Self-pay | Admitting: Family Medicine

## 2017-09-04 ENCOUNTER — Ambulatory Visit: Payer: PRIVATE HEALTH INSURANCE | Admitting: Family Medicine

## 2017-09-04 VITALS — BP 124/82 | HR 93 | Temp 97.9°F | Wt 230.2 lb

## 2017-09-04 DIAGNOSIS — J018 Other acute sinusitis: Secondary | ICD-10-CM | POA: Diagnosis not present

## 2017-09-04 MED ORDER — AMOXICILLIN-POT CLAVULANATE 875-125 MG PO TABS: 1.0000 | ORAL_TABLET | Freq: Two times a day (BID) | ORAL | 0 refills | Status: DC

## 2017-09-04 MED ORDER — METHYLPREDNISOLONE 4 MG PO TBPK: ORAL_TABLET | ORAL | 0 refills | Status: DC

## 2017-09-04 NOTE — Progress Notes (Signed)
   Subjective:    Patient ID: Dwayne Woodard, male    DOB: 09/12/1955, 62 y.o.   MRN: [id number]  HPI Here to follow up an urgent care visit on  08-29-17 for chest congestion, coughing and SOB. He was given a nebulization treatment and was prescribed a Zpack and a Proair inhaler. He is now no better and he has more sinus congestion. No fever.   Review of Systems  Constitutional: Negative.   HENT: Positive for congestion, postnasal drip, sinus pressure and sinus pain. Negative for sore throat.   Eyes: Negative.   Respiratory: Positive for cough and chest tightness. Negative for wheezing.   Cardiovascular: Negative.        Objective:   Physical Exam  Constitutional: He appears well-developed and well-nourished.  HENT:  Right Ear: External ear normal.  Left Ear: External ear normal.  Nose: Nose normal.  Mouth/Throat: Oropharynx is clear and moist.  Eyes: Conjunctivae are normal.  Neck: No thyromegaly present.  Pulmonary/Chest: Effort normal and breath sounds normal. No respiratory distress. He has no wheezes. He has no rales.  Lymphadenopathy:    He has no cervical adenopathy.          Assessment & Plan:  Partially treated sinusitis. Given a Medrol dose pack and Augmentin. Written out of work today until 09-08-17.  Gershon CraneStephen Fry, MD

## 2017-09-12 DIAGNOSIS — R634 Abnormal weight loss: Secondary | ICD-10-CM | POA: Diagnosis not present

## 2017-09-12 DIAGNOSIS — Z0000 Encounter for general adult medical examination without abnormal findings: Secondary | ICD-10-CM | POA: Diagnosis not present

## 2017-09-12 DIAGNOSIS — E039 Hypothyroidism, unspecified: Secondary | ICD-10-CM | POA: Diagnosis not present

## 2017-09-12 DIAGNOSIS — R197 Diarrhea, unspecified: Secondary | ICD-10-CM | POA: Diagnosis not present

## 2017-09-12 DIAGNOSIS — G20 Parkinson's disease: Secondary | ICD-10-CM | POA: Diagnosis not present

## 2017-09-12 DIAGNOSIS — R251 Tremor, unspecified: Secondary | ICD-10-CM | POA: Diagnosis not present

## 2017-09-12 DIAGNOSIS — K5909 Other constipation: Secondary | ICD-10-CM | POA: Diagnosis not present

## 2017-09-12 DIAGNOSIS — R351 Nocturia: Secondary | ICD-10-CM | POA: Diagnosis not present

## 2017-09-12 DIAGNOSIS — K56609 Unspecified intestinal obstruction, unspecified as to partial versus complete obstruction: Secondary | ICD-10-CM | POA: Diagnosis not present

## 2017-09-12 DIAGNOSIS — N401 Enlarged prostate with lower urinary tract symptoms: Secondary | ICD-10-CM | POA: Diagnosis not present

## 2017-10-06 DIAGNOSIS — L603 Nail dystrophy: Secondary | ICD-10-CM | POA: Diagnosis not present

## 2017-10-06 DIAGNOSIS — L84 Corns and callosities: Secondary | ICD-10-CM | POA: Diagnosis not present

## 2017-10-06 DIAGNOSIS — I739 Peripheral vascular disease, unspecified: Secondary | ICD-10-CM | POA: Diagnosis not present

## 2017-10-16 ENCOUNTER — Other Ambulatory Visit: Payer: Self-pay | Admitting: Family Medicine

## 2017-10-17 NOTE — Telephone Encounter (Signed)
Rx/forms faxed. Fax confirmation received.  

## 2017-10-17 NOTE — Telephone Encounter (Signed)
Last OV: 09/04/2017 Last filled: 04/24/2017, #90, 5 RF Sig: TAKE 1 TABLET BY MOUTH THREE TIMES A DAY UDS: Not on file within the last year

## 2017-10-17 NOTE — Telephone Encounter (Signed)
Call in #[phone] with 5 rf 

## 2017-10-21 DIAGNOSIS — R634 Abnormal weight loss: Secondary | ICD-10-CM | POA: Diagnosis not present

## 2017-10-21 DIAGNOSIS — N401 Enlarged prostate with lower urinary tract symptoms: Secondary | ICD-10-CM | POA: Diagnosis not present

## 2017-10-21 DIAGNOSIS — K5909 Other constipation: Secondary | ICD-10-CM | POA: Diagnosis not present

## 2017-10-21 DIAGNOSIS — R351 Nocturia: Secondary | ICD-10-CM | POA: Diagnosis not present

## 2017-10-21 DIAGNOSIS — G20 Parkinson's disease: Secondary | ICD-10-CM | POA: Diagnosis not present

## 2017-10-21 DIAGNOSIS — Z79899 Other long term (current) drug therapy: Secondary | ICD-10-CM | POA: Diagnosis not present

## 2017-10-21 DIAGNOSIS — D649 Anemia, unspecified: Secondary | ICD-10-CM | POA: Diagnosis not present

## 2017-10-21 DIAGNOSIS — E039 Hypothyroidism, unspecified: Secondary | ICD-10-CM | POA: Diagnosis not present

## 2017-10-21 DIAGNOSIS — K5730 Diverticulosis of large intestine without perforation or abscess without bleeding: Secondary | ICD-10-CM | POA: Diagnosis not present

## 2017-10-25 ENCOUNTER — Other Ambulatory Visit: Payer: Self-pay | Admitting: Family Medicine

## 2017-10-27 ENCOUNTER — Telehealth: Payer: Self-pay | Admitting: Family Medicine

## 2017-10-27 NOTE — Telephone Encounter (Signed)
Copied from CRM [id number]. Topic: Quick Communication - See Telephone Encounter >> Oct 27, 2017 11:20 AM Waymon AmatoBurton, Donna F wrote: CRM for notification. See Telephone encounter for:  Pt states that his alprazalam is not been approved   Best number to call is [phone] 10/27/17.

## 2017-10-27 NOTE — Telephone Encounter (Signed)
Rx was printed on 10/17/2017 call pharmacy. If they do not have it call Rx in.

## 2017-10-28 NOTE — Telephone Encounter (Signed)
Called pt and left a VM to call back.  

## 2017-10-29 NOTE — Telephone Encounter (Signed)
Called pt and spoke with pt's wife. Pt's wife gave me her husband's work number to call [phone]. Called pt's work number and left a VM to call back.

## 2017-10-30 NOTE — Telephone Encounter (Signed)
Called pt work number and left a VM to call back

## 2017-10-30 NOTE — Telephone Encounter (Signed)
Called and spoke with pt. Everything is OK pt was requesting for a refill on xanax but he has already received it. Ok to close message.

## 2017-11-03 DIAGNOSIS — R918 Other nonspecific abnormal finding of lung field: Secondary | ICD-10-CM | POA: Diagnosis not present

## 2017-11-03 DIAGNOSIS — J9811 Atelectasis: Secondary | ICD-10-CM | POA: Diagnosis not present

## 2017-11-03 DIAGNOSIS — B3741 Candidal cystitis and urethritis: Secondary | ICD-10-CM | POA: Diagnosis not present

## 2017-11-03 DIAGNOSIS — R8290 Unspecified abnormal findings in urine: Secondary | ICD-10-CM | POA: Diagnosis not present

## 2017-11-03 DIAGNOSIS — K5900 Constipation, unspecified: Secondary | ICD-10-CM | POA: Diagnosis not present

## 2017-11-03 DIAGNOSIS — R531 Weakness: Secondary | ICD-10-CM | POA: Diagnosis not present

## 2017-11-03 DIAGNOSIS — R197 Diarrhea, unspecified: Secondary | ICD-10-CM | POA: Diagnosis not present

## 2017-11-06 DIAGNOSIS — E039 Hypothyroidism, unspecified: Secondary | ICD-10-CM | POA: Diagnosis not present

## 2017-11-06 DIAGNOSIS — R634 Abnormal weight loss: Secondary | ICD-10-CM | POA: Diagnosis not present

## 2017-11-06 DIAGNOSIS — N401 Enlarged prostate with lower urinary tract symptoms: Secondary | ICD-10-CM | POA: Diagnosis not present

## 2017-11-06 DIAGNOSIS — G20 Parkinson's disease: Secondary | ICD-10-CM | POA: Diagnosis not present

## 2017-11-06 DIAGNOSIS — K5909 Other constipation: Secondary | ICD-10-CM | POA: Diagnosis not present

## 2017-11-06 DIAGNOSIS — R351 Nocturia: Secondary | ICD-10-CM | POA: Diagnosis not present

## 2017-11-06 DIAGNOSIS — N3000 Acute cystitis without hematuria: Secondary | ICD-10-CM | POA: Diagnosis not present

## 2017-11-09 DIAGNOSIS — K5900 Constipation, unspecified: Secondary | ICD-10-CM | POA: Diagnosis not present

## 2017-11-24 DIAGNOSIS — Z9049 Acquired absence of other specified parts of digestive tract: Secondary | ICD-10-CM | POA: Diagnosis not present

## 2017-11-24 DIAGNOSIS — K5900 Constipation, unspecified: Secondary | ICD-10-CM | POA: Diagnosis not present

## 2017-11-28 DIAGNOSIS — R531 Weakness: Secondary | ICD-10-CM | POA: Diagnosis not present

## 2017-11-28 DIAGNOSIS — Z8659 Personal history of other mental and behavioral disorders: Secondary | ICD-10-CM | POA: Diagnosis not present

## 2017-11-29 DIAGNOSIS — G20 Parkinson's disease: Secondary | ICD-10-CM | POA: Diagnosis not present

## 2017-11-29 DIAGNOSIS — I6789 Other cerebrovascular disease: Secondary | ICD-10-CM | POA: Diagnosis not present

## 2017-11-29 DIAGNOSIS — I10 Essential (primary) hypertension: Secondary | ICD-10-CM | POA: Diagnosis not present

## 2017-11-29 DIAGNOSIS — K5900 Constipation, unspecified: Secondary | ICD-10-CM | POA: Diagnosis not present

## 2017-11-29 DIAGNOSIS — R338 Other retention of urine: Secondary | ICD-10-CM | POA: Diagnosis not present

## 2017-11-29 DIAGNOSIS — E860 Dehydration: Secondary | ICD-10-CM | POA: Diagnosis not present

## 2017-11-29 DIAGNOSIS — L570 Actinic keratosis: Secondary | ICD-10-CM | POA: Diagnosis not present

## 2017-11-29 DIAGNOSIS — K5641 Fecal impaction: Secondary | ICD-10-CM | POA: Diagnosis not present

## 2017-11-29 DIAGNOSIS — K56609 Unspecified intestinal obstruction, unspecified as to partial versus complete obstruction: Secondary | ICD-10-CM | POA: Diagnosis not present

## 2017-11-29 DIAGNOSIS — J690 Pneumonitis due to inhalation of food and vomit: Secondary | ICD-10-CM | POA: Diagnosis not present

## 2017-11-29 DIAGNOSIS — R918 Other nonspecific abnormal finding of lung field: Secondary | ICD-10-CM | POA: Diagnosis not present

## 2017-11-29 DIAGNOSIS — Z681 Body mass index (BMI) 19 or less, adult: Secondary | ICD-10-CM | POA: Diagnosis not present

## 2017-11-29 DIAGNOSIS — R509 Fever, unspecified: Secondary | ICD-10-CM | POA: Diagnosis not present

## 2017-11-29 DIAGNOSIS — R251 Tremor, unspecified: Secondary | ICD-10-CM | POA: Diagnosis not present

## 2017-11-29 DIAGNOSIS — R410 Disorientation, unspecified: Secondary | ICD-10-CM | POA: Diagnosis not present

## 2017-11-29 DIAGNOSIS — R627 Adult failure to thrive: Secondary | ICD-10-CM | POA: Diagnosis not present

## 2017-11-29 DIAGNOSIS — Z66 Do not resuscitate: Secondary | ICD-10-CM | POA: Diagnosis not present

## 2017-11-29 DIAGNOSIS — J9601 Acute respiratory failure with hypoxia: Secondary | ICD-10-CM | POA: Diagnosis not present

## 2017-11-29 DIAGNOSIS — K5649 Other impaction of intestine: Secondary | ICD-10-CM | POA: Diagnosis not present

## 2017-11-29 DIAGNOSIS — E039 Hypothyroidism, unspecified: Secondary | ICD-10-CM | POA: Diagnosis not present

## 2017-11-29 DIAGNOSIS — K5909 Other constipation: Secondary | ICD-10-CM | POA: Diagnosis not present

## 2017-11-29 DIAGNOSIS — Z515 Encounter for palliative care: Secondary | ICD-10-CM | POA: Diagnosis not present

## 2017-11-29 DIAGNOSIS — N401 Enlarged prostate with lower urinary tract symptoms: Secondary | ICD-10-CM | POA: Diagnosis not present

## 2017-11-29 DIAGNOSIS — Z87891 Personal history of nicotine dependence: Secondary | ICD-10-CM | POA: Diagnosis not present

## 2017-11-29 DIAGNOSIS — N3289 Other specified disorders of bladder: Secondary | ICD-10-CM | POA: Diagnosis not present

## 2017-11-29 DIAGNOSIS — J189 Pneumonia, unspecified organism: Secondary | ICD-10-CM | POA: Diagnosis not present

## 2017-11-29 DIAGNOSIS — G9341 Metabolic encephalopathy: Secondary | ICD-10-CM | POA: Diagnosis not present

## 2017-11-29 DIAGNOSIS — N281 Cyst of kidney, acquired: Secondary | ICD-10-CM | POA: Diagnosis not present

## 2017-11-29 DIAGNOSIS — R4182 Altered mental status, unspecified: Secondary | ICD-10-CM | POA: Diagnosis not present

## 2017-11-30 DIAGNOSIS — J189 Pneumonia, unspecified organism: Secondary | ICD-10-CM | POA: Diagnosis not present

## 2017-11-30 DIAGNOSIS — R140 Abdominal distension (gaseous): Secondary | ICD-10-CM | POA: Diagnosis not present

## 2017-11-30 DIAGNOSIS — K6389 Other specified diseases of intestine: Secondary | ICD-10-CM | POA: Diagnosis not present

## 2017-11-30 DIAGNOSIS — G20 Parkinson's disease: Secondary | ICD-10-CM | POA: Diagnosis not present

## 2017-11-30 DIAGNOSIS — E039 Hypothyroidism, unspecified: Secondary | ICD-10-CM | POA: Diagnosis not present

## 2017-11-30 DIAGNOSIS — K5641 Fecal impaction: Secondary | ICD-10-CM | POA: Diagnosis not present

## 2017-12-01 DIAGNOSIS — E039 Hypothyroidism, unspecified: Secondary | ICD-10-CM | POA: Diagnosis not present

## 2017-12-01 DIAGNOSIS — J189 Pneumonia, unspecified organism: Secondary | ICD-10-CM | POA: Diagnosis not present

## 2017-12-01 DIAGNOSIS — K5641 Fecal impaction: Secondary | ICD-10-CM | POA: Diagnosis not present

## 2017-12-01 DIAGNOSIS — G20 Parkinson's disease: Secondary | ICD-10-CM | POA: Diagnosis not present

## 2017-12-02 DIAGNOSIS — J189 Pneumonia, unspecified organism: Secondary | ICD-10-CM | POA: Diagnosis not present

## 2017-12-02 DIAGNOSIS — K5641 Fecal impaction: Secondary | ICD-10-CM | POA: Diagnosis not present

## 2017-12-02 DIAGNOSIS — K5909 Other constipation: Secondary | ICD-10-CM | POA: Diagnosis not present

## 2017-12-02 DIAGNOSIS — E039 Hypothyroidism, unspecified: Secondary | ICD-10-CM | POA: Diagnosis not present

## 2017-12-02 DIAGNOSIS — G20 Parkinson's disease: Secondary | ICD-10-CM | POA: Diagnosis not present

## 2017-12-02 DIAGNOSIS — R4189 Other symptoms and signs involving cognitive functions and awareness: Secondary | ICD-10-CM | POA: Diagnosis not present

## 2017-12-02 DIAGNOSIS — N401 Enlarged prostate with lower urinary tract symptoms: Secondary | ICD-10-CM | POA: Diagnosis not present

## 2017-12-03 DIAGNOSIS — R4189 Other symptoms and signs involving cognitive functions and awareness: Secondary | ICD-10-CM | POA: Diagnosis not present

## 2017-12-03 DIAGNOSIS — J189 Pneumonia, unspecified organism: Secondary | ICD-10-CM | POA: Diagnosis not present

## 2017-12-03 DIAGNOSIS — K5909 Other constipation: Secondary | ICD-10-CM | POA: Diagnosis not present

## 2017-12-03 DIAGNOSIS — N401 Enlarged prostate with lower urinary tract symptoms: Secondary | ICD-10-CM | POA: Diagnosis not present

## 2017-12-04 DIAGNOSIS — R112 Nausea with vomiting, unspecified: Secondary | ICD-10-CM | POA: Diagnosis not present

## 2017-12-04 DIAGNOSIS — R4189 Other symptoms and signs involving cognitive functions and awareness: Secondary | ICD-10-CM | POA: Diagnosis not present

## 2017-12-04 DIAGNOSIS — N401 Enlarged prostate with lower urinary tract symptoms: Secondary | ICD-10-CM | POA: Diagnosis not present

## 2017-12-04 DIAGNOSIS — K5909 Other constipation: Secondary | ICD-10-CM | POA: Diagnosis not present

## 2017-12-04 DIAGNOSIS — K6389 Other specified diseases of intestine: Secondary | ICD-10-CM | POA: Diagnosis not present

## 2017-12-04 DIAGNOSIS — J189 Pneumonia, unspecified organism: Secondary | ICD-10-CM | POA: Diagnosis not present

## 2017-12-05 DIAGNOSIS — N401 Enlarged prostate with lower urinary tract symptoms: Secondary | ICD-10-CM | POA: Diagnosis not present

## 2017-12-05 DIAGNOSIS — K5909 Other constipation: Secondary | ICD-10-CM | POA: Diagnosis not present

## 2017-12-05 DIAGNOSIS — R4189 Other symptoms and signs involving cognitive functions and awareness: Secondary | ICD-10-CM | POA: Diagnosis not present

## 2017-12-05 DIAGNOSIS — J189 Pneumonia, unspecified organism: Secondary | ICD-10-CM | POA: Diagnosis not present

## 2017-12-06 DIAGNOSIS — K5909 Other constipation: Secondary | ICD-10-CM | POA: Diagnosis not present

## 2017-12-06 DIAGNOSIS — N401 Enlarged prostate with lower urinary tract symptoms: Secondary | ICD-10-CM | POA: Diagnosis not present

## 2017-12-06 DIAGNOSIS — R4189 Other symptoms and signs involving cognitive functions and awareness: Secondary | ICD-10-CM | POA: Diagnosis not present

## 2017-12-06 DIAGNOSIS — J189 Pneumonia, unspecified organism: Secondary | ICD-10-CM | POA: Diagnosis not present

## 2017-12-07 DIAGNOSIS — R1310 Dysphagia, unspecified: Secondary | ICD-10-CM | POA: Diagnosis not present

## 2017-12-07 DIAGNOSIS — R4189 Other symptoms and signs involving cognitive functions and awareness: Secondary | ICD-10-CM | POA: Diagnosis not present

## 2017-12-07 DIAGNOSIS — K5909 Other constipation: Secondary | ICD-10-CM | POA: Diagnosis not present

## 2017-12-07 DIAGNOSIS — N401 Enlarged prostate with lower urinary tract symptoms: Secondary | ICD-10-CM | POA: Diagnosis not present

## 2017-12-07 DIAGNOSIS — J189 Pneumonia, unspecified organism: Secondary | ICD-10-CM | POA: Diagnosis not present

## 2017-12-08 DIAGNOSIS — G20 Parkinson's disease: Secondary | ICD-10-CM | POA: Diagnosis not present

## 2017-12-08 DIAGNOSIS — R633 Feeding difficulties: Secondary | ICD-10-CM | POA: Diagnosis not present

## 2017-12-08 DIAGNOSIS — Z4682 Encounter for fitting and adjustment of non-vascular catheter: Secondary | ICD-10-CM | POA: Diagnosis not present

## 2017-12-08 DIAGNOSIS — R4189 Other symptoms and signs involving cognitive functions and awareness: Secondary | ICD-10-CM | POA: Diagnosis not present

## 2017-12-08 DIAGNOSIS — N401 Enlarged prostate with lower urinary tract symptoms: Secondary | ICD-10-CM | POA: Diagnosis not present

## 2017-12-08 DIAGNOSIS — K5909 Other constipation: Secondary | ICD-10-CM | POA: Diagnosis not present

## 2017-12-08 DIAGNOSIS — R1310 Dysphagia, unspecified: Secondary | ICD-10-CM | POA: Diagnosis not present

## 2017-12-08 DIAGNOSIS — J189 Pneumonia, unspecified organism: Secondary | ICD-10-CM | POA: Diagnosis not present

## 2017-12-09 DIAGNOSIS — J189 Pneumonia, unspecified organism: Secondary | ICD-10-CM | POA: Diagnosis not present

## 2017-12-09 DIAGNOSIS — Z4682 Encounter for fitting and adjustment of non-vascular catheter: Secondary | ICD-10-CM | POA: Diagnosis not present

## 2017-12-10 DIAGNOSIS — J189 Pneumonia, unspecified organism: Secondary | ICD-10-CM | POA: Diagnosis not present

## 2017-12-10 DIAGNOSIS — Z4682 Encounter for fitting and adjustment of non-vascular catheter: Secondary | ICD-10-CM | POA: Diagnosis not present

## 2017-12-11 DIAGNOSIS — R1310 Dysphagia, unspecified: Secondary | ICD-10-CM | POA: Diagnosis not present

## 2017-12-11 DIAGNOSIS — J189 Pneumonia, unspecified organism: Secondary | ICD-10-CM | POA: Diagnosis not present

## 2017-12-11 DIAGNOSIS — R05 Cough: Secondary | ICD-10-CM | POA: Diagnosis not present

## 2017-12-12 DIAGNOSIS — J189 Pneumonia, unspecified organism: Secondary | ICD-10-CM | POA: Diagnosis not present

## 2017-12-13 DIAGNOSIS — J189 Pneumonia, unspecified organism: Secondary | ICD-10-CM | POA: Diagnosis not present

## 2017-12-14 DIAGNOSIS — G20 Parkinson's disease: Secondary | ICD-10-CM | POA: Diagnosis not present

## 2017-12-14 DIAGNOSIS — G9341 Metabolic encephalopathy: Secondary | ICD-10-CM | POA: Diagnosis not present

## 2017-12-14 DIAGNOSIS — J189 Pneumonia, unspecified organism: Secondary | ICD-10-CM | POA: Diagnosis not present

## 2017-12-14 DIAGNOSIS — J181 Lobar pneumonia, unspecified organism: Secondary | ICD-10-CM | POA: Diagnosis not present

## 2017-12-15 DIAGNOSIS — J181 Lobar pneumonia, unspecified organism: Secondary | ICD-10-CM | POA: Diagnosis not present

## 2017-12-15 DIAGNOSIS — G9341 Metabolic encephalopathy: Secondary | ICD-10-CM | POA: Diagnosis not present

## 2017-12-15 DIAGNOSIS — G20 Parkinson's disease: Secondary | ICD-10-CM | POA: Diagnosis not present

## 2017-12-15 DIAGNOSIS — J189 Pneumonia, unspecified organism: Secondary | ICD-10-CM | POA: Diagnosis not present

## 2017-12-16 DIAGNOSIS — G20 Parkinson's disease: Secondary | ICD-10-CM | POA: Diagnosis not present

## 2017-12-16 DIAGNOSIS — R339 Retention of urine, unspecified: Secondary | ICD-10-CM | POA: Diagnosis not present

## 2017-12-16 DIAGNOSIS — R4189 Other symptoms and signs involving cognitive functions and awareness: Secondary | ICD-10-CM | POA: Diagnosis not present

## 2017-12-16 DIAGNOSIS — Z515 Encounter for palliative care: Secondary | ICD-10-CM | POA: Diagnosis not present

## 2017-12-17 DIAGNOSIS — R339 Retention of urine, unspecified: Secondary | ICD-10-CM | POA: Diagnosis not present

## 2017-12-17 DIAGNOSIS — R4189 Other symptoms and signs involving cognitive functions and awareness: Secondary | ICD-10-CM | POA: Diagnosis not present

## 2017-12-17 DIAGNOSIS — G20 Parkinson's disease: Secondary | ICD-10-CM | POA: Diagnosis not present

## 2017-12-17 DIAGNOSIS — Z515 Encounter for palliative care: Secondary | ICD-10-CM | POA: Diagnosis not present

## 2017-12-18 DIAGNOSIS — G20 Parkinson's disease: Secondary | ICD-10-CM | POA: Diagnosis not present

## 2017-12-18 DIAGNOSIS — R4189 Other symptoms and signs involving cognitive functions and awareness: Secondary | ICD-10-CM | POA: Diagnosis not present

## 2017-12-18 DIAGNOSIS — R339 Retention of urine, unspecified: Secondary | ICD-10-CM | POA: Diagnosis not present

## 2017-12-18 DIAGNOSIS — Z515 Encounter for palliative care: Secondary | ICD-10-CM | POA: Diagnosis not present

## 2017-12-19 DIAGNOSIS — G20 Parkinson's disease: Secondary | ICD-10-CM | POA: Diagnosis not present

## 2017-12-19 DIAGNOSIS — R339 Retention of urine, unspecified: Secondary | ICD-10-CM | POA: Diagnosis not present

## 2017-12-19 DIAGNOSIS — Z515 Encounter for palliative care: Secondary | ICD-10-CM | POA: Diagnosis not present

## 2017-12-19 DIAGNOSIS — R4189 Other symptoms and signs involving cognitive functions and awareness: Secondary | ICD-10-CM | POA: Diagnosis not present

## 2017-12-20 DIAGNOSIS — R339 Retention of urine, unspecified: Secondary | ICD-10-CM | POA: Diagnosis not present

## 2017-12-20 DIAGNOSIS — G20 Parkinson's disease: Secondary | ICD-10-CM | POA: Diagnosis not present

## 2017-12-20 DIAGNOSIS — Z515 Encounter for palliative care: Secondary | ICD-10-CM | POA: Diagnosis not present

## 2017-12-20 DIAGNOSIS — R4189 Other symptoms and signs involving cognitive functions and awareness: Secondary | ICD-10-CM | POA: Diagnosis not present

## 2017-12-21 DIAGNOSIS — R4189 Other symptoms and signs involving cognitive functions and awareness: Secondary | ICD-10-CM | POA: Diagnosis not present

## 2017-12-21 DIAGNOSIS — G20 Parkinson's disease: Secondary | ICD-10-CM | POA: Diagnosis not present

## 2017-12-21 DIAGNOSIS — Z515 Encounter for palliative care: Secondary | ICD-10-CM | POA: Diagnosis not present

## 2017-12-21 DIAGNOSIS — R339 Retention of urine, unspecified: Secondary | ICD-10-CM | POA: Diagnosis not present

## 2017-12-22 DIAGNOSIS — R339 Retention of urine, unspecified: Secondary | ICD-10-CM | POA: Diagnosis not present

## 2017-12-22 DIAGNOSIS — Z515 Encounter for palliative care: Secondary | ICD-10-CM | POA: Diagnosis not present

## 2017-12-22 DIAGNOSIS — R4189 Other symptoms and signs involving cognitive functions and awareness: Secondary | ICD-10-CM | POA: Diagnosis not present

## 2017-12-22 DIAGNOSIS — G20 Parkinson's disease: Secondary | ICD-10-CM | POA: Diagnosis not present

## 2017-12-23 DIAGNOSIS — Z515 Encounter for palliative care: Secondary | ICD-10-CM | POA: Diagnosis not present

## 2017-12-23 DIAGNOSIS — R4189 Other symptoms and signs involving cognitive functions and awareness: Secondary | ICD-10-CM | POA: Diagnosis not present

## 2017-12-23 DIAGNOSIS — G20 Parkinson's disease: Secondary | ICD-10-CM | POA: Diagnosis not present

## 2017-12-23 DIAGNOSIS — R339 Retention of urine, unspecified: Secondary | ICD-10-CM | POA: Diagnosis not present

## 2017-12-24 DIAGNOSIS — G20 Parkinson's disease: Secondary | ICD-10-CM | POA: Diagnosis not present

## 2017-12-24 DIAGNOSIS — R4189 Other symptoms and signs involving cognitive functions and awareness: Secondary | ICD-10-CM | POA: Diagnosis not present

## 2017-12-24 DIAGNOSIS — R339 Retention of urine, unspecified: Secondary | ICD-10-CM | POA: Diagnosis not present

## 2017-12-24 DIAGNOSIS — Z515 Encounter for palliative care: Secondary | ICD-10-CM | POA: Diagnosis not present

## 2017-12-25 DIAGNOSIS — R339 Retention of urine, unspecified: Secondary | ICD-10-CM | POA: Diagnosis not present

## 2017-12-25 DIAGNOSIS — R4189 Other symptoms and signs involving cognitive functions and awareness: Secondary | ICD-10-CM | POA: Diagnosis not present

## 2017-12-25 DIAGNOSIS — G20 Parkinson's disease: Secondary | ICD-10-CM | POA: Diagnosis not present

## 2017-12-25 DIAGNOSIS — Z515 Encounter for palliative care: Secondary | ICD-10-CM | POA: Diagnosis not present

## 2017-12-26 DIAGNOSIS — Z515 Encounter for palliative care: Secondary | ICD-10-CM | POA: Diagnosis not present

## 2017-12-26 DIAGNOSIS — J189 Pneumonia, unspecified organism: Secondary | ICD-10-CM | POA: Diagnosis not present

## 2017-12-26 DIAGNOSIS — G20 Parkinson's disease: Secondary | ICD-10-CM | POA: Diagnosis not present

## 2017-12-26 DIAGNOSIS — G9341 Metabolic encephalopathy: Secondary | ICD-10-CM | POA: Diagnosis not present

## 2017-12-27 DIAGNOSIS — G20 Parkinson's disease: Secondary | ICD-10-CM | POA: Diagnosis not present

## 2017-12-27 DIAGNOSIS — Z515 Encounter for palliative care: Secondary | ICD-10-CM | POA: Diagnosis not present

## 2017-12-27 DIAGNOSIS — G9341 Metabolic encephalopathy: Secondary | ICD-10-CM | POA: Diagnosis not present

## 2017-12-27 DIAGNOSIS — J189 Pneumonia, unspecified organism: Secondary | ICD-10-CM | POA: Diagnosis not present

## 2017-12-28 DIAGNOSIS — Z515 Encounter for palliative care: Secondary | ICD-10-CM | POA: Diagnosis not present

## 2017-12-28 DIAGNOSIS — G9341 Metabolic encephalopathy: Secondary | ICD-10-CM | POA: Diagnosis not present

## 2017-12-28 DIAGNOSIS — G20 Parkinson's disease: Secondary | ICD-10-CM | POA: Diagnosis not present

## 2017-12-28 DIAGNOSIS — J189 Pneumonia, unspecified organism: Secondary | ICD-10-CM | POA: Diagnosis not present

## 2017-12-29 DIAGNOSIS — G20 Parkinson's disease: Secondary | ICD-10-CM | POA: Diagnosis not present

## 2017-12-29 DIAGNOSIS — R4189 Other symptoms and signs involving cognitive functions and awareness: Secondary | ICD-10-CM | POA: Diagnosis not present

## 2017-12-29 DIAGNOSIS — Z515 Encounter for palliative care: Secondary | ICD-10-CM | POA: Diagnosis not present

## 2017-12-29 DIAGNOSIS — R339 Retention of urine, unspecified: Secondary | ICD-10-CM | POA: Diagnosis not present

## 2017-12-30 DIAGNOSIS — R339 Retention of urine, unspecified: Secondary | ICD-10-CM | POA: Diagnosis not present

## 2017-12-30 DIAGNOSIS — R4189 Other symptoms and signs involving cognitive functions and awareness: Secondary | ICD-10-CM | POA: Diagnosis not present

## 2017-12-30 DIAGNOSIS — Z515 Encounter for palliative care: Secondary | ICD-10-CM | POA: Diagnosis not present

## 2017-12-30 DIAGNOSIS — G20 Parkinson's disease: Secondary | ICD-10-CM | POA: Diagnosis not present

## 2018-01-15 ENCOUNTER — Other Ambulatory Visit: Payer: Self-pay | Admitting: Family Medicine

## 2018-02-02 ENCOUNTER — Telehealth: Payer: Self-pay | Admitting: Family Medicine

## 2018-02-02 ENCOUNTER — Ambulatory Visit: Payer: PRIVATE HEALTH INSURANCE | Admitting: Family Medicine

## 2018-02-02 ENCOUNTER — Ambulatory Visit: Payer: Self-pay

## 2018-02-02 VITALS — BP 118/68 | HR 97 | Temp 98.3°F | Resp 18 | Wt 201.3 lb

## 2018-02-02 DIAGNOSIS — K5720 Diverticulitis of large intestine with perforation and abscess without bleeding: Secondary | ICD-10-CM | POA: Insufficient documentation

## 2018-02-02 DIAGNOSIS — R1032 Left lower quadrant pain: Secondary | ICD-10-CM

## 2018-02-02 MED ORDER — AMOXICILLIN-POT CLAVULANATE 875-125 MG PO TABS: 1.0000 | ORAL_TABLET | Freq: Two times a day (BID) | ORAL | 0 refills | Status: DC

## 2018-02-02 NOTE — Telephone Encounter (Signed)
Phone call from pt.  Reported after seeing Dr. Selena BattenKim this morning in the office, his pain has worsened. Reported the pain is moving up a little higher in the left abdomen, and now is at 5-6/10.  Reported the pain is constant.  Denied nausea/ vomiting.  Denied fever.  Stated he would like to go ahead and get a CT scan [facility] in [place].  Phone call to [employer].  Stated that Dr. Selena BattenKim is out of office this afternoon, and pt. Should proceed to ER, with worsening pain. Pt. Was advised of recommendation to proceed to the ER, due to worsening abdominal pain.  Verb. Understanding.  Agreed to do this.  Reported he will have another adult to drive him.
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
## 2018-02-02 NOTE — Telephone Encounter (Signed)
Copied from CRM [id number]. Topic: Quick Communication - See Telephone Encounter >> Feb 02, 2018  1:46 PM Luanna Coleawoud, Jessica L wrote: CRM for notification. See Telephone encounter for: 02/02/18. Pt called and wants to proceed with ct scan. He wants to have it done at [facility] in [place] pt provided ph number [phone]

## 2018-02-02 NOTE — Telephone Encounter (Signed)
Noted  

## 2018-02-02 NOTE — Telephone Encounter (Signed)
Seen by Dr. Arville LimeKim   Sent to Dr. Selena BattenKim

## 2018-02-02 NOTE — Telephone Encounter (Signed)
Agree if worsening should go to ER.

## 2018-02-02 NOTE — Telephone Encounter (Signed)
Pt. Reports 4-5 days of abdominal pain. Left side of umbilicus at the belt line. Sometimes pain shoots upward. Hurts worse with BM and coughing.States has had "some diarrhea." Request early appointment due to work. Sees Dr. Clent RidgesFry. OK to accommodate pt. Per Eber Jonesarolyn. Appointment with Dr. Selena BattenKim.   Reason for Disposition . [1] MODERATE pain (e.g., interferes with normal activities) AND [2] pain comes and goes (cramps) AND [3] present > 24 hours  (Exception: pain with Vomiting or Diarrhea - see that Guideline)  Answer Assessment - Initial Assessment Questions 1. LOCATION: "Where does it hurt?"      Left at the belt line 2. RADIATION: "Does the pain shoot anywhere else?" (e.g., chest, back)     Goes up above the belly button 3. ONSET: "When did the pain begin?" (Minutes, hours or days ago)      4-5 DAYS AGO 4. SUDDEN: "Gradual or sudden onset?"     Gradual 5. PATTERN "Does the pain come and go, or is it constant?"    - If constant: "Is it getting better, staying the same, or worsening?"      (Note: Constant means the pain never goes away completely; most serious pain is constant and it progresses)     - If intermittent: "How long does it last?" "Do you have pain now?"     (Note: Intermittent means the pain goes away completely between bouts)     Now the pain is constant 6. SEVERITY: "How bad is the pain?"  (e.g., Scale 1-10; mild, moderate, or severe)    - MILD (1-3): doesn't interfere with normal activities, abdomen soft and not tender to touch     - MODERATE (4-7): interferes with normal activities or awakens from sleep, tender to touch     - SEVERE (8-10): excruciating pain, doubled over, unable to do any normal activities       3-4 7. RECURRENT SYMPTOM: "Have you ever had this type of abdominal pain before?" If so, ask: "When was the last time?" and "What happened that time?"      No 8. CAUSE: "What do you think is causing the abdominal pain?"     Unsure 9. RELIEVING/AGGRAVATING FACTORS: "What  makes it better or worse?" (e.g., movement, antacids, bowel movement)     BM made it worse 10. OTHER SYMPTOMS: "Has there been any vomiting, diarrhea, constipation, or urine problems?"       Diarrhea  Protocols used: ABDOMINAL PAIN - MALE-A-AH

## 2018-02-02 NOTE — Telephone Encounter (Signed)
Pt going to [facility] ED for evaluation. Pain has increased to 6/10.

## 2018-02-02 NOTE — Patient Instructions (Addendum)
BEFORE YOU LEAVE: -follow up: 2 days (Wednesday) with PCP  Start the antibiotic and take as instructed.  Clear diet today with soup broth, clear liquids. Advance to pureed diet tomorrow if feeling better.  I hope you are feeling better soon! Seek care promptly if your symptoms worsen, new concerns arise or you are not improving with treatment. Go to the emergency room if severe pain, worsening, fevers or not continuing to improve.

## 2018-02-03 NOTE — Telephone Encounter (Signed)
Spoke with patient and he did go to the ED.

## 2018-02-04 ENCOUNTER — Ambulatory Visit: Payer: PRIVATE HEALTH INSURANCE | Admitting: Family Medicine

## 2018-03-19 HISTORY — PX: COLON SURGERY: SHX602

## 2018-03-27 ENCOUNTER — Encounter: Payer: Self-pay | Admitting: Gastroenterology

## 2018-04-18 ENCOUNTER — Other Ambulatory Visit: Payer: Self-pay | Admitting: Family Medicine

## 2018-04-21 DIAGNOSIS — K5720 Diverticulitis of large intestine with perforation and abscess without bleeding: Secondary | ICD-10-CM | POA: Diagnosis not present

## 2018-04-21 NOTE — Telephone Encounter (Signed)
Last fill 10/17/17 Last OV 02/02/18 (acute)  Ok to fill?

## 2018-04-22 NOTE — Telephone Encounter (Signed)
Rx has been called in  

## 2018-04-22 NOTE — Telephone Encounter (Signed)
Call in #[phone] with 5 rf 

## 2018-04-30 DIAGNOSIS — F419 Anxiety disorder, unspecified: Secondary | ICD-10-CM | POA: Diagnosis not present

## 2018-04-30 DIAGNOSIS — Z79899 Other long term (current) drug therapy: Secondary | ICD-10-CM | POA: Diagnosis not present

## 2018-04-30 DIAGNOSIS — Z01810 Encounter for preprocedural cardiovascular examination: Secondary | ICD-10-CM | POA: Diagnosis not present

## 2018-04-30 DIAGNOSIS — K219 Gastro-esophageal reflux disease without esophagitis: Secondary | ICD-10-CM | POA: Diagnosis not present

## 2018-04-30 DIAGNOSIS — K5720 Diverticulitis of large intestine with perforation and abscess without bleeding: Secondary | ICD-10-CM | POA: Diagnosis not present

## 2018-04-30 DIAGNOSIS — F17200 Nicotine dependence, unspecified, uncomplicated: Secondary | ICD-10-CM | POA: Diagnosis not present

## 2018-04-30 DIAGNOSIS — I10 Essential (primary) hypertension: Secondary | ICD-10-CM | POA: Diagnosis not present

## 2018-04-30 DIAGNOSIS — F329 Major depressive disorder, single episode, unspecified: Secondary | ICD-10-CM | POA: Diagnosis not present

## 2018-04-30 DIAGNOSIS — E785 Hyperlipidemia, unspecified: Secondary | ICD-10-CM | POA: Diagnosis not present

## 2018-05-06 DIAGNOSIS — K5720 Diverticulitis of large intestine with perforation and abscess without bleeding: Secondary | ICD-10-CM | POA: Diagnosis not present

## 2018-05-06 DIAGNOSIS — F17200 Nicotine dependence, unspecified, uncomplicated: Secondary | ICD-10-CM | POA: Diagnosis not present

## 2018-05-06 DIAGNOSIS — K5732 Diverticulitis of large intestine without perforation or abscess without bleeding: Secondary | ICD-10-CM | POA: Diagnosis not present

## 2018-05-06 DIAGNOSIS — G8918 Other acute postprocedural pain: Secondary | ICD-10-CM | POA: Diagnosis not present

## 2018-05-06 DIAGNOSIS — F329 Major depressive disorder, single episode, unspecified: Secondary | ICD-10-CM | POA: Diagnosis not present

## 2018-05-06 DIAGNOSIS — Z9049 Acquired absence of other specified parts of digestive tract: Secondary | ICD-10-CM | POA: Diagnosis not present

## 2018-05-06 DIAGNOSIS — F419 Anxiety disorder, unspecified: Secondary | ICD-10-CM | POA: Diagnosis not present

## 2018-05-06 DIAGNOSIS — K219 Gastro-esophageal reflux disease without esophagitis: Secondary | ICD-10-CM | POA: Diagnosis not present

## 2018-05-06 DIAGNOSIS — K589 Irritable bowel syndrome without diarrhea: Secondary | ICD-10-CM | POA: Diagnosis not present

## 2018-05-06 DIAGNOSIS — K660 Peritoneal adhesions (postprocedural) (postinfection): Secondary | ICD-10-CM | POA: Diagnosis not present

## 2018-05-06 DIAGNOSIS — Z79899 Other long term (current) drug therapy: Secondary | ICD-10-CM | POA: Diagnosis not present

## 2018-05-06 DIAGNOSIS — I10 Essential (primary) hypertension: Secondary | ICD-10-CM | POA: Diagnosis not present

## 2018-05-11 MED ORDER — GENERIC EXTERNAL MEDICATION
4.00 | Status: DC
Start: ? — End: 2018-05-11

## 2018-05-11 MED ORDER — METOPROLOL SUCCINATE ER 25 MG PO TB24
25.00 | ORAL_TABLET | ORAL | Status: DC
Start: 2018-05-10 — End: 2018-05-11

## 2018-05-11 MED ORDER — HYDROCODONE-ACETAMINOPHEN 5-325 MG PO TABS
1.00 | ORAL_TABLET | ORAL | Status: DC
Start: ? — End: 2018-05-11

## 2018-05-11 MED ORDER — NICOTINE 21 MG/24HR TD PT24
1.00 | MEDICATED_PATCH | TRANSDERMAL | Status: DC
Start: 2018-05-10 — End: 2018-05-11

## 2018-05-11 MED ORDER — SODIUM CHLORIDE 0.9 % IV SOLN
10.00 | INTRAVENOUS | Status: DC
Start: ? — End: 2018-05-11

## 2018-05-11 MED ORDER — HYDRALAZINE HCL 20 MG/ML IJ SOLN
5.00 | INTRAMUSCULAR | Status: DC
Start: ? — End: 2018-05-11

## 2018-05-11 MED ORDER — ENOXAPARIN SODIUM 40 MG/0.4ML ~~LOC~~ SOLN
40.00 | SUBCUTANEOUS | Status: DC
Start: 2018-05-10 — End: 2018-05-11

## 2018-05-11 MED ORDER — GENERIC EXTERNAL MEDICATION
10.00 | Status: DC
Start: ? — End: 2018-05-11

## 2018-05-11 MED ORDER — ALPRAZOLAM 1 MG PO TABS
1.00 | ORAL_TABLET | ORAL | Status: DC
Start: ? — End: 2018-05-11

## 2018-07-22 DIAGNOSIS — K5792 Diverticulitis of intestine, part unspecified, without perforation or abscess without bleeding: Secondary | ICD-10-CM | POA: Diagnosis not present

## 2018-07-22 DIAGNOSIS — Z1211 Encounter for screening for malignant neoplasm of colon: Secondary | ICD-10-CM | POA: Diagnosis not present

## 2018-07-22 HISTORY — PX: COLONOSCOPY: SHX174

## 2018-07-31 ENCOUNTER — Other Ambulatory Visit: Payer: Self-pay | Admitting: Family Medicine

## 2018-08-07 ENCOUNTER — Ambulatory Visit (INDEPENDENT_AMBULATORY_CARE_PROVIDER_SITE_OTHER): Payer: BLUE CROSS/BLUE SHIELD | Admitting: Family Medicine

## 2018-08-07 ENCOUNTER — Encounter: Payer: Self-pay | Admitting: Family Medicine

## 2018-08-07 VITALS — BP 120/72 | HR 65 | Temp 97.9°F | Ht 70.5 in | Wt 202.2 lb

## 2018-08-07 DIAGNOSIS — Z0000 Encounter for general adult medical examination without abnormal findings: Secondary | ICD-10-CM

## 2018-08-07 DIAGNOSIS — Z23 Encounter for immunization: Secondary | ICD-10-CM | POA: Diagnosis not present

## 2018-08-07 LAB — POC URINALSYSI DIPSTICK (AUTOMATED)
Bilirubin, UA: NEGATIVE
Blood, UA: NEGATIVE
Glucose, UA: NEGATIVE
KETONES UA: NEGATIVE
Leukocytes, UA: NEGATIVE
Nitrite, UA: NEGATIVE
PROTEIN UA: NEGATIVE
Spec Grav, UA: 1.02 (ref 1.010–1.025)
Urobilinogen, UA: 0.2 E.U./dL
pH, UA: 6 (ref 5.0–8.0)

## 2018-08-07 MED ORDER — ESCITALOPRAM OXALATE 10 MG PO TABS: 10.0000 mg | ORAL_TABLET | Freq: Every day | ORAL | 2 refills | Status: DC

## 2018-08-07 NOTE — Progress Notes (Signed)
Subjective:    Patient ID: Dwayne Woodard, male    DOB: 03-26-1956, 62 y.o.   MRN: [id number]  HPI Here for a well exam. He feels good but he has been a little depressed. His father passed away recently and this has been difficult for him to deal with. He takes Xanax TID, but he still feels sad and has a lack of motivation. Sleep and appetite are intact. He has stopped using all alcohol, but he still smokes.    Review of Systems  Constitutional: Negative.   HENT: Negative.   Eyes: Negative.   Respiratory: Negative.   Cardiovascular: Negative.   Gastrointestinal: Negative.   Genitourinary: Negative.   Musculoskeletal: Negative.   Skin: Negative.   Neurological: Negative.   Psychiatric/Behavioral: Positive for decreased concentration and dysphoric mood. Negative for agitation, behavioral problems, hallucinations, self-injury, sleep disturbance and suicidal ideas. The patient is nervous/anxious.        Objective:   Physical Exam Constitutional:      General: He is not in acute distress.    Appearance: He is well-developed. He is not diaphoretic.  HENT:     Head: Normocephalic and atraumatic.     Right Ear: External ear normal.     Left Ear: External ear normal.     Nose: Nose normal.     Mouth/Throat:     Pharynx: No oropharyngeal exudate.  Eyes:     General: No scleral icterus.       Right eye: No discharge.        Left eye: No discharge.     Conjunctiva/sclera: Conjunctivae normal.     Pupils: Pupils are equal, round, and reactive to light.  Neck:     Musculoskeletal: Neck supple.     Thyroid: No thyromegaly.     Vascular: No JVD.     Trachea: No tracheal deviation.  Cardiovascular:     Rate and Rhythm: Normal rate and regular rhythm.     Heart sounds: Normal heart sounds. No murmur. No friction rub. No gallop.   Pulmonary:     Effort: Pulmonary effort is normal. No respiratory distress.     Breath sounds: Normal breath sounds. No wheezing or rales.  Chest:    Chest wall: No tenderness.  Abdominal:     General: Bowel sounds are normal. There is no distension.     Palpations: Abdomen is soft. There is no mass.     Tenderness: There is no abdominal tenderness. There is no guarding or rebound.  Genitourinary:    Penis: Normal. No tenderness.      Prostate: Normal.     Rectum: Normal. Guaiac result negative.  Musculoskeletal: Normal range of motion.        General: No tenderness.  Lymphadenopathy:     Cervical: No cervical adenopathy.  Skin:    General: Skin is warm and dry.     Coloration: Skin is not pale.     Findings: No erythema or rash.  Neurological:     Mental Status: He is alert and oriented to person, place, and time.     Cranial Nerves: No cranial nerve deficit.     Motor: No abnormal muscle tone.     Coordination: Coordination normal.     Deep Tendon Reflexes: Reflexes are normal and symmetric. Reflexes normal.  Psychiatric:        Behavior: Behavior normal.        Thought Content: Thought content normal.  Judgment: Judgment normal.           Assessment & Plan:  Well exam. We discussed diet and exercise. Get fasting labs. For mild depression he will try Lexapro 10 mg daily. Recheck in one month.  Dwayne CraneStephen Fry, MD

## 2018-08-08 LAB — HEPATIC FUNCTION PANEL
AG Ratio: 1.3 (calc) (ref 1.0–2.5)
ALT: 13 U/L (ref 9–46)
AST: 17 U/L (ref 10–35)
Albumin: 4.2 g/dL (ref 3.6–5.1)
Alkaline phosphatase (APISO): 83 U/L (ref 40–115)
BILIRUBIN INDIRECT: 0.3 mg/dL (ref 0.2–1.2)
Bilirubin, Direct: 0.1 mg/dL (ref 0.0–0.2)
Globulin: 3.2 g/dL (calc) (ref 1.9–3.7)
Total Bilirubin: 0.4 mg/dL (ref 0.2–1.2)
Total Protein: 7.4 g/dL (ref 6.1–8.1)

## 2018-08-08 LAB — CBC WITH DIFFERENTIAL/PLATELET
Absolute Monocytes: 560 cells/uL (ref 200–950)
Basophils Absolute: 72 cells/uL (ref 0–200)
Basophils Relative: 0.9 %
Eosinophils Absolute: 336 cells/uL (ref 15–500)
Eosinophils Relative: 4.2 %
HCT: 46.6 % (ref 38.5–50.0)
Hemoglobin: 16.4 g/dL (ref 13.2–17.1)
Lymphs Abs: 3264 cells/uL (ref 850–3900)
MCH: 31.8 pg (ref 27.0–33.0)
MCHC: 35.2 g/dL (ref 32.0–36.0)
MCV: 90.3 fL (ref 80.0–100.0)
MPV: 10.3 fL (ref 7.5–12.5)
Monocytes Relative: 7 %
Neutro Abs: 3768 cells/uL (ref 1500–7800)
Neutrophils Relative %: 47.1 %
Platelets: 259 10*3/uL (ref 140–400)
RBC: 5.16 10*6/uL (ref 4.20–5.80)
RDW: 11.9 % (ref 11.0–15.0)
Total Lymphocyte: 40.8 %
WBC: 8 10*3/uL (ref 3.8–10.8)

## 2018-08-08 LAB — BASIC METABOLIC PANEL
BUN: 12 mg/dL (ref 7–25)
CO2: 24 mmol/L (ref 20–32)
Calcium: 9.8 mg/dL (ref 8.6–10.3)
Chloride: 105 mmol/L (ref 98–110)
Creat: 0.94 mg/dL (ref 0.70–1.25)
GLUCOSE: 96 mg/dL (ref 65–99)
Potassium: 5.6 mmol/L — ABNORMAL HIGH (ref 3.5–5.3)
SODIUM: 144 mmol/L (ref 135–146)

## 2018-08-08 LAB — LIPID PANEL
Cholesterol: 262 mg/dL — ABNORMAL HIGH (ref <200)
HDL: 45 mg/dL (ref >40)
LDL Cholesterol (Calc): 182 mg/dL (calc) — ABNORMAL HIGH
Non-HDL Cholesterol (Calc): 217 mg/dL (calc) — ABNORMAL HIGH (ref <130)
Total CHOL/HDL Ratio: 5.8 (calc) — ABNORMAL HIGH (ref <5.0)
Triglycerides: 191 mg/dL — ABNORMAL HIGH (ref <150)

## 2018-08-08 LAB — PSA: PSA: 1.2 ng/mL (ref <=4.0)

## 2018-08-08 LAB — TSH: TSH: 2.12 mIU/L (ref 0.40–4.50)

## 2018-08-11 ENCOUNTER — Other Ambulatory Visit: Payer: Self-pay | Admitting: Family Medicine

## 2018-08-11 MED ORDER — ATORVASTATIN CALCIUM 10 MG PO TABS: 10.0000 mg | ORAL_TABLET | Freq: Every day | ORAL | 3 refills | Status: DC

## 2018-08-13 ENCOUNTER — Telehealth: Payer: Self-pay

## 2018-08-13 NOTE — Telephone Encounter (Signed)
Copied from CRM [phone]. Topic: General - Other >> Aug 13, 2018 12:56 PM Fanny BienIlderton, Jessica L wrote: Reason for CRM: pt called and stated that the nurse disclosed lab results but the pt was busy when she called and it went in one ear and out the other. Pt would like a call back to discuss labs. Please advise

## 2018-08-13 NOTE — Telephone Encounter (Signed)
Notes recorded by Nelwyn SalisburyFry, Stephen A, MD on 08/10/2018 at 7:15 AM EST Normal except very high cholesterol. Start on Lipitor 10 mg daily. Call in #90 with 3 rf and recheck in 90 days  Contains abnormal data Basic metabolic panel    Called and spoke with pt and he is aware of results and of meds sent to his pharmacy.  He will call back to schedule the appt with the lab.

## 2018-08-30 ENCOUNTER — Other Ambulatory Visit: Payer: Self-pay | Admitting: Family Medicine

## 2018-10-22 ENCOUNTER — Other Ambulatory Visit: Payer: Self-pay | Admitting: Family Medicine

## 2018-10-22 NOTE — Telephone Encounter (Signed)
Dr. Fry please advise on refill of medication.  Thanks  

## 2018-10-23 NOTE — Telephone Encounter (Signed)
Refill called to the pharmacy and left on the VM 

## 2018-10-23 NOTE — Telephone Encounter (Signed)
Call in #[phone] with 5 rf 

## 2018-10-29 ENCOUNTER — Other Ambulatory Visit: Payer: Self-pay | Admitting: Family Medicine

## 2019-01-21 DIAGNOSIS — Z1159 Encounter for screening for other viral diseases: Secondary | ICD-10-CM | POA: Diagnosis not present

## 2019-01-26 DIAGNOSIS — Z1211 Encounter for screening for malignant neoplasm of colon: Secondary | ICD-10-CM | POA: Diagnosis not present

## 2019-03-22 ENCOUNTER — Other Ambulatory Visit: Payer: Self-pay | Admitting: Family Medicine

## 2019-03-31 ENCOUNTER — Ambulatory Visit (INDEPENDENT_AMBULATORY_CARE_PROVIDER_SITE_OTHER): Payer: BC Managed Care – PPO | Admitting: Family Medicine

## 2019-03-31 ENCOUNTER — Encounter: Payer: Self-pay | Admitting: Family Medicine

## 2019-03-31 VITALS — BP 120/62 | HR 81 | Temp 98.1°F | Wt 193.6 lb

## 2019-03-31 DIAGNOSIS — M720 Palmar fascial fibromatosis [Dupuytren]: Secondary | ICD-10-CM

## 2019-03-31 DIAGNOSIS — F320 Major depressive disorder, single episode, mild: Secondary | ICD-10-CM

## 2019-03-31 DIAGNOSIS — Z0000 Encounter for general adult medical examination without abnormal findings: Secondary | ICD-10-CM

## 2019-03-31 DIAGNOSIS — I10 Essential (primary) hypertension: Secondary | ICD-10-CM | POA: Diagnosis not present

## 2019-03-31 DIAGNOSIS — M25421 Effusion, right elbow: Secondary | ICD-10-CM

## 2019-03-31 DIAGNOSIS — M65351 Trigger finger, right little finger: Secondary | ICD-10-CM

## 2019-03-31 MED ORDER — ALPRAZOLAM 1 MG PO TABS: 1.0000 mg | ORAL_TABLET | Freq: Three times a day (TID) | ORAL | 5 refills | Status: DC | PRN

## 2019-03-31 NOTE — Progress Notes (Signed)
Subjective:    Patient ID: Dwayne Woodard, male    DOB: [birth year], 63 y.o.   MRN: [id number]  HPI Here to follow up on issues. He has been taking Lexapro for mild depression and he is pleased with how it helps him. He has had swelling at the right elbow for about one month. No hx of trauma. This is not painful. Also for several months he has had tender knots in the right palm and the fingers on this hand sometimes do not straighten out all the way. His BP has been stable.    Review of Systems  Constitutional: Negative.   HENT: Negative.   Eyes: Negative.   Respiratory: Negative.   Cardiovascular: Negative.   Gastrointestinal: Negative.   Genitourinary: Negative.   Musculoskeletal: Positive for arthralgias and joint swelling.  Skin: Negative.   Neurological: Negative.   Psychiatric/Behavioral: Negative.        Objective:   Physical Exam Constitutional:      General: He is not in acute distress.    Appearance: He is well-developed. He is not diaphoretic.  HENT:     Head: Normocephalic and atraumatic.     Right Ear: External ear normal.     Left Ear: External ear normal.     Nose: Nose normal.     Mouth/Throat:     Pharynx: No oropharyngeal exudate.  Eyes:     General: No scleral icterus.       Right eye: No discharge.        Left eye: No discharge.     Conjunctiva/sclera: Conjunctivae normal.     Pupils: Pupils are equal, round, and reactive to light.  Neck:     Musculoskeletal: Neck supple.     Thyroid: No thyromegaly.     Vascular: No JVD.     Trachea: No tracheal deviation.  Cardiovascular:     Rate and Rhythm: Normal rate and regular rhythm.     Heart sounds: Normal heart sounds. No murmur. No friction rub. No gallop.   Pulmonary:     Effort: Pulmonary effort is normal. No respiratory distress.     Breath sounds: Normal breath sounds. No wheezing or rales.  Chest:     Chest wall: No tenderness.  Abdominal:     General: Bowel sounds are normal. There is  no distension.     Palpations: Abdomen is soft. There is no mass.     Tenderness: There is no abdominal tenderness. There is no guarding or rebound.  Genitourinary:    Penis: No tenderness.   Musculoskeletal:        General: No tenderness.     Comments: There is a bursa effusion at the right olecranon. No tenderness and ROM is full. He has tender knots in the right palm along thr flexor tendons. The right 5th finger will not extend past 90 degrees   Lymphadenopathy:     Cervical: No cervical adenopathy.  Skin:    General: Skin is warm and dry.     Coloration: Skin is not pale.     Findings: No erythema or rash.  Neurological:     Mental Status: He is alert and oriented to person, place, and time.     Cranial Nerves: No cranial nerve deficit.     Motor: No abnormal muscle tone.     Coordination: Coordination normal.     Deep Tendon Reflexes: Reflexes are normal and symmetric. Reflexes normal.  Psychiatric:  Behavior: Behavior normal.        Thought Content: Thought content normal.        Judgment: Judgment normal.           Assessment & Plan:  His HTN and depression are stable. He has an olecranon bursa effusion, palmer calcifications and a trigger finger. He has decided to just observe the hand and elbow for now. Recheck prn. Alysia Penna, MD

## 2019-04-20 ENCOUNTER — Other Ambulatory Visit: Payer: Self-pay

## 2019-04-27 ENCOUNTER — Other Ambulatory Visit: Payer: Self-pay

## 2019-09-24 ENCOUNTER — Other Ambulatory Visit: Payer: Self-pay | Admitting: Family Medicine

## 2019-09-30 ENCOUNTER — Other Ambulatory Visit: Payer: Self-pay | Admitting: Family Medicine

## 2019-09-30 NOTE — Telephone Encounter (Signed)
Last filled 03/31/2019 Last OV 03/31/2019  Ok to fill?

## 2019-10-05 ENCOUNTER — Telehealth: Payer: Self-pay | Admitting: Family Medicine

## 2019-10-05 NOTE — Telephone Encounter (Signed)
Pt is requesting a refill on Lexapro 10 MG tablet. Pt uses [facility] in [place]. Per pt, please call him when medication is sent to the pharmacy. Thanks

## 2019-10-05 NOTE — Telephone Encounter (Signed)
Pt is calling back stating that he does not one the medication to [facility] [place] he stated that the the mail order pharmacy ([facility]) already has the Rx and do not need it to go to a local pharmacy.  Please cancel the local Rx.

## 2019-10-05 NOTE — Telephone Encounter (Signed)
Noted  

## 2019-11-01 ENCOUNTER — Other Ambulatory Visit: Payer: Self-pay | Admitting: Family Medicine

## 2020-04-25 ENCOUNTER — Telehealth: Payer: Self-pay | Admitting: Family Medicine

## 2020-04-25 MED ORDER — ATORVASTATIN CALCIUM 10 MG PO TABS
10.0000 mg | ORAL_TABLET | Freq: Every day | ORAL | 0 refills | Status: DC
Start: 2020-04-25 — End: 2020-04-26

## 2020-04-25 MED ORDER — ESCITALOPRAM OXALATE 10 MG PO TABS
10.0000 mg | ORAL_TABLET | Freq: Every day | ORAL | 0 refills | Status: DC
Start: 2020-04-25 — End: 2020-04-26

## 2020-04-25 NOTE — Telephone Encounter (Signed)
30 day supply sent in. The patient needs a physical for further refills.   Spoke with the patient. A physical has been scheduled for 04/28/2020  Xanax last filled 11/01/2019 Ok to fill?

## 2020-04-25 NOTE — Telephone Encounter (Signed)
Pt spoke to his pharmacy and they informed him to contact his PCP b/c they faxed [place] twice.   Medication Refill: (He only has 2 left of these meds) Escitalopram Atorvastatin  Pharmacy: [facility] - [place], [place] - [address] [place] Phone:  [phone]  Fax:  [phone]         Medication Refill: [id number] [place], [place] - [address] Phone:  [phone]  Fax:  [phone]

## 2020-04-26 ENCOUNTER — Telehealth: Payer: Self-pay | Admitting: Family Medicine

## 2020-04-26 MED ORDER — ATORVASTATIN CALCIUM 10 MG PO TABS
10.0000 mg | ORAL_TABLET | Freq: Every day | ORAL | 0 refills | Status: DC
Start: 2020-04-26 — End: 2020-08-04

## 2020-04-26 MED ORDER — ESCITALOPRAM OXALATE 10 MG PO TABS
10.0000 mg | ORAL_TABLET | Freq: Every day | ORAL | 0 refills | Status: DC
Start: 2020-04-26 — End: 2020-08-04

## 2020-04-26 MED ORDER — ALPRAZOLAM 1 MG PO TABS
1.0000 mg | ORAL_TABLET | Freq: Three times a day (TID) | ORAL | 5 refills | Status: DC | PRN
Start: 2020-04-26 — End: 2020-10-23

## 2020-04-26 NOTE — Addendum Note (Signed)
Addended by: Gershon Crane A on: 04/26/2020 08:00 AM   Modules accepted: Orders

## 2020-04-26 NOTE — Telephone Encounter (Signed)
Rxs sent

## 2020-04-26 NOTE — Telephone Encounter (Signed)
Muriel from [facility] order pharmacy stated that they only dispense 90 day refills and asking to request 90 days for the: Atorvastatin Escitalopram   Phone: [phone]  Ref # [id number]  Or resend fax/electronically for refills

## 2020-04-26 NOTE — Telephone Encounter (Signed)
The Xanax was sent in

## 2020-04-27 ENCOUNTER — Other Ambulatory Visit: Payer: Self-pay

## 2020-04-28 ENCOUNTER — Encounter: Payer: Self-pay | Admitting: Family Medicine

## 2020-04-28 ENCOUNTER — Ambulatory Visit (INDEPENDENT_AMBULATORY_CARE_PROVIDER_SITE_OTHER): Payer: PRIVATE HEALTH INSURANCE | Admitting: Family Medicine

## 2020-04-28 ENCOUNTER — Ambulatory Visit (INDEPENDENT_AMBULATORY_CARE_PROVIDER_SITE_OTHER)
Admission: RE | Admit: 2020-04-28 | Discharge: 2020-04-28 | Disposition: A | Discharge status: Home / Self Care | Payer: PRIVATE HEALTH INSURANCE | Source: Ambulatory Visit | Attending: Family Medicine | Admitting: Family Medicine

## 2020-04-28 ENCOUNTER — Other Ambulatory Visit: Payer: Self-pay

## 2020-04-28 VITALS — BP 120/80 | HR 74 | Temp 98.4°F | Ht 70.0 in | Wt 213.0 lb

## 2020-04-28 DIAGNOSIS — R05 Cough: Secondary | ICD-10-CM

## 2020-04-28 DIAGNOSIS — Z0000 Encounter for general adult medical examination without abnormal findings: Secondary | ICD-10-CM

## 2020-04-28 DIAGNOSIS — Z23 Encounter for immunization: Secondary | ICD-10-CM | POA: Diagnosis not present

## 2020-04-28 DIAGNOSIS — R059 Cough, unspecified: Secondary | ICD-10-CM

## 2020-04-28 NOTE — Addendum Note (Signed)
Addended by: Carola Rhine on: 04/28/2020 02:16 PM   Modules accepted: Orders

## 2020-04-28 NOTE — Progress Notes (Signed)
Subjective:    Patient ID: Dwayne Woodard, male    DOB: 19-Oct-1955, 64 y.o.   MRN: [id number]  HPI Here for a well exam. He feels fine in general, but he does mention a cough that started a few years ago. He coughs up clear sputum every morning and then it quiets down during the day. No SOB or chest pain. He still smokes 2 ppd of cigarettes. He is retired and he keeps busy. He just finished building an addition to his garage.    Review of Systems  Constitutional: Negative.   HENT: Negative.   Eyes: Negative.   Respiratory: Positive for cough.   Cardiovascular: Negative.   Gastrointestinal: Negative.   Genitourinary: Negative.   Musculoskeletal: Negative.   Skin: Negative.   Neurological: Negative.   Psychiatric/Behavioral: Negative.        Objective:   Physical Exam Constitutional:      General: He is not in acute distress.    Appearance: He is well-developed. He is not diaphoretic.  HENT:     Head: Normocephalic and atraumatic.     Right Ear: External ear normal.     Left Ear: External ear normal.     Nose: Nose normal.     Mouth/Throat:     Pharynx: No oropharyngeal exudate.  Eyes:     General: No scleral icterus.       Right eye: No discharge.        Left eye: No discharge.     Conjunctiva/sclera: Conjunctivae normal.     Pupils: Pupils are equal, round, and reactive to light.  Neck:     Thyroid: No thyromegaly.     Vascular: No JVD.     Trachea: No tracheal deviation.  Cardiovascular:     Rate and Rhythm: Normal rate and regular rhythm.     Heart sounds: Normal heart sounds. No murmur heard.  No friction rub. No gallop.   Pulmonary:     Effort: Pulmonary effort is normal. No respiratory distress.     Breath sounds: Normal breath sounds. No wheezing or rales.  Chest:     Chest wall: No tenderness.  Abdominal:     General: Bowel sounds are normal. There is no distension.     Palpations: Abdomen is soft. There is no mass.     Tenderness: There is no  abdominal tenderness. There is no guarding or rebound.  Genitourinary:    Penis: Normal. No tenderness.      Testes: Normal.     Prostate: Normal.     Rectum: Normal. Guaiac result negative.  Musculoskeletal:        General: No tenderness. Normal range of motion.     Cervical back: Neck supple.  Lymphadenopathy:     Cervical: No cervical adenopathy.  Skin:    General: Skin is warm and dry.     Coloration: Skin is not pale.     Findings: No erythema or rash.  Neurological:     Mental Status: He is alert and oriented to person, place, and time.     Cranial Nerves: No cranial nerve deficit.     Motor: No abnormal muscle tone.     Coordination: Coordination normal.     Deep Tendon Reflexes: Reflexes are normal and symmetric. Reflexes normal.  Psychiatric:        Behavior: Behavior normal.        Thought Content: Thought content normal.        Judgment: Judgment normal.  Assessment & Plan:  Well exam. We discussed diet and exercise. I again urged him to quit smoking. It sounds like he has some chronic bronchitis, so we will send him for a CXR today. Get fasting labs.  Gershon Crane, MD

## 2020-04-29 LAB — LIPID PANEL
Cholesterol: 152 mg/dL (ref <200)
HDL: 55 mg/dL (ref >=40)
LDL Cholesterol (Calc): 75 mg/dL (calc)
Non-HDL Cholesterol (Calc): 97 mg/dL (calc) (ref <130)
Total CHOL/HDL Ratio: 2.8 (calc) (ref <5.0)
Triglycerides: 132 mg/dL (ref <150)

## 2020-04-29 LAB — HEPATIC FUNCTION PANEL
AG Ratio: 1.4 (calc) (ref 1.0–2.5)
ALT: 15 U/L (ref 9–46)
AST: 17 U/L (ref 10–35)
Albumin: 3.8 g/dL (ref 3.6–5.1)
Alkaline phosphatase (APISO): 63 U/L (ref 35–144)
Bilirubin, Direct: 0.1 mg/dL (ref 0.0–0.2)
Globulin: 2.8 g/dL (calc) (ref 1.9–3.7)
Indirect Bilirubin: 0.5 mg/dL (calc) (ref 0.2–1.2)
Total Bilirubin: 0.6 mg/dL (ref 0.2–1.2)
Total Protein: 6.6 g/dL (ref 6.1–8.1)

## 2020-04-29 LAB — CBC WITH DIFFERENTIAL/PLATELET
Absolute Monocytes: 429 cells/uL (ref 200–950)
Basophils Absolute: 40 cells/uL (ref 0–200)
Basophils Relative: 0.6 %
Eosinophils Absolute: 248 cells/uL (ref 15–500)
Eosinophils Relative: 3.7 %
HCT: 44.4 % (ref 38.5–50.0)
Hemoglobin: 14.8 g/dL (ref 13.2–17.1)
Lymphs Abs: 2305 cells/uL (ref 850–3900)
MCH: 31.6 pg (ref 27.0–33.0)
MCHC: 33.3 g/dL (ref 32.0–36.0)
MCV: 94.9 fL (ref 80.0–100.0)
MPV: 10.7 fL (ref 7.5–12.5)
Monocytes Relative: 6.4 %
Neutro Abs: 3678 cells/uL (ref 1500–7800)
Neutrophils Relative %: 54.9 %
Platelets: 185 10*3/uL (ref 140–400)
RBC: 4.68 10*6/uL (ref 4.20–5.80)
RDW: 12.2 % (ref 11.0–15.0)
Total Lymphocyte: 34.4 %
WBC: 6.7 10*3/uL (ref 3.8–10.8)

## 2020-04-29 LAB — BASIC METABOLIC PANEL
BUN: 14 mg/dL (ref 7–25)
CO2: 33 mmol/L — ABNORMAL HIGH (ref 20–32)
Calcium: 9 mg/dL (ref 8.6–10.3)
Chloride: 102 mmol/L (ref 98–110)
Creat: 1.07 mg/dL (ref 0.70–1.25)
Glucose, Bld: 99 mg/dL (ref 65–99)
Potassium: 5 mmol/L (ref 3.5–5.3)
Sodium: 139 mmol/L (ref 135–146)

## 2020-04-29 LAB — TSH: TSH: 2.74 mIU/L (ref 0.40–4.50)

## 2020-05-06 ENCOUNTER — Other Ambulatory Visit: Payer: Self-pay | Admitting: Family Medicine

## 2020-08-03 ENCOUNTER — Other Ambulatory Visit: Payer: Self-pay | Admitting: Family Medicine

## 2020-09-19 DIAGNOSIS — M7021 Olecranon bursitis, right elbow: Secondary | ICD-10-CM | POA: Diagnosis not present

## 2020-10-23 ENCOUNTER — Other Ambulatory Visit: Payer: Self-pay | Admitting: Family Medicine

## 2020-10-23 NOTE — Telephone Encounter (Signed)
Last office visit- 04/26/2020 Last refill- 04/26/2020--90 tabs with 5 refill  No future appointment scheduled

## 2020-11-03 ENCOUNTER — Telehealth: Payer: Self-pay | Admitting: Family Medicine

## 2020-11-03 ENCOUNTER — Other Ambulatory Visit: Payer: Self-pay

## 2020-11-03 ENCOUNTER — Other Ambulatory Visit: Payer: Self-pay | Admitting: Family Medicine

## 2020-11-03 MED ORDER — METOPROLOL SUCCINATE ER 25 MG PO TB24: 25.0000 mg | ORAL_TABLET | Freq: Every day | ORAL | 3 refills | Status: DC

## 2020-11-03 NOTE — Telephone Encounter (Signed)
Pt is calling in stating that he is out of Rx metoprolol succinate (TOPROL XL) 25 MG #90, escitalopram (LEXAPRO) 10 MG #90   [facility] [place] [place]

## 2020-11-03 NOTE — Telephone Encounter (Signed)
Pt metoprolol has been sent to pt pharmacy, pt is schedule for a telephone visit on Monday 11/06/2020 for refill on Lexapro

## 2020-11-06 ENCOUNTER — Encounter: Payer: Self-pay | Admitting: Family Medicine

## 2020-11-06 ENCOUNTER — Telehealth (INDEPENDENT_AMBULATORY_CARE_PROVIDER_SITE_OTHER): Payer: PRIVATE HEALTH INSURANCE | Admitting: Family Medicine

## 2020-11-06 DIAGNOSIS — F418 Other specified anxiety disorders: Secondary | ICD-10-CM

## 2020-11-06 DIAGNOSIS — J3089 Other allergic rhinitis: Secondary | ICD-10-CM | POA: Insufficient documentation

## 2020-11-06 MED ORDER — CETIRIZINE HCL 10 MG PO TABS: 10.0000 mg | ORAL_TABLET | Freq: Every day | ORAL | 11 refills | Status: DC

## 2020-11-06 MED ORDER — ESCITALOPRAM OXALATE 10 MG PO TABS: 10.0000 mg | ORAL_TABLET | Freq: Every day | ORAL | 3 refills | Status: DC

## 2020-11-06 NOTE — Progress Notes (Signed)
   Subjective:    Patient ID: Dwayne Woodard, male    DOB: 02-24-1956, 65 y.o.   MRN: [id number]  HPI Virtual Visit via Telephone Note  I connected with the patient on 11/06/20 at  2:30 PM EDT by telephone and verified that I am speaking with the correct person using two identifiers.   I discussed the limitations, risks, security and privacy concerns of performing an evaluation and management service by telephone and the availability of in person appointments. I also discussed with the patient that there may be a patient responsible charge related to this service. The patient expressed understanding and agreed to proceed.  Location patient: home Location provider: work or home office Participants present for the call: patient, provider Patient did not have a visit in the prior 7 days to address this/these issue(s).   History of Present Illness: Here for several issues. First he is pleased with how Lexapro helps his anxiety, and he want to stay on this. Also his springtime allergies are back, causing sneezing , runny nose, and a dry cough.    Observations/Objective: Patient sounds cheerful and well on the phone. I do not appreciate any SOB. Speech and thought processing are grossly intact. Patient reported vitals:  Assessment and Plan: Anxiety is stable, so we will refill the Lexapro. For the allergies, he will try Zyrtec 10 mg daily.  Gershon Crane, MD   Follow Up Instructions:     [phone] 5-10 [phone] 11-20 [id number] 21-30 I did not refer this patient for an OV in the next 24 hours for this/these issue(s).  I discussed the assessment and treatment plan with the patient. The patient was provided an opportunity to ask questions and all were answered. The patient agreed with the plan and demonstrated an understanding of the instructions.   The patient was advised to call back or seek an in-person evaluation if the symptoms worsen or if the condition fails to improve as  anticipated.  I provided 12 minutes of non-face-to-face time during this encounter.   Gershon Crane, MD    Review of Systems     Objective:   Physical Exam        Assessment & Plan:

## 2021-02-03 ENCOUNTER — Other Ambulatory Visit: Payer: Self-pay | Admitting: Family Medicine

## 2021-05-01 ENCOUNTER — Telehealth: Payer: Self-pay | Admitting: Family Medicine

## 2021-05-01 MED ORDER — ALPRAZOLAM 1 MG PO TABS: 1.0000 mg | ORAL_TABLET | Freq: Three times a day (TID) | ORAL | 5 refills | Status: DC | PRN

## 2021-05-01 NOTE — Addendum Note (Signed)
Addended by: Gershon Crane A on: 05/01/2021 02:13 PM   Modules accepted: Orders

## 2021-05-01 NOTE — Telephone Encounter (Signed)
Lvm informing patient prescription has been sent to [facility]

## 2021-05-01 NOTE — Telephone Encounter (Signed)
PT called to request a refill of the following meds to [facility] [place], [place]. They need a refill asap as they are getting low on the following:  escitalopram (LEXAPRO) 10 MG tablet  metoprolol succinate (TOPROL-XL) 25 MG 24 hr tablet atorvastatin (LIPITOR) 10 MG tablet  ALPRAZolam (XANAX) 1 MG tablet

## 2021-05-01 NOTE — Telephone Encounter (Signed)
Last refill-10/23/20--90 tabs, 5 refills Last video visit- 11/06/20  [facility] [place]

## 2021-05-01 NOTE — Telephone Encounter (Signed)
Done

## 2021-05-01 NOTE — Telephone Encounter (Signed)
PT call back to advise he only needs the ALPRAZolam [facility]) 1 MG tablet refill as everything else has refills on it. Please refill asap as he is out.

## 2021-05-03 ENCOUNTER — Other Ambulatory Visit: Payer: Self-pay | Admitting: Family Medicine

## 2021-05-25 ENCOUNTER — Other Ambulatory Visit: Payer: Self-pay | Admitting: Family Medicine

## 2021-05-28 ENCOUNTER — Other Ambulatory Visit: Payer: Self-pay

## 2021-05-28 ENCOUNTER — Encounter: Payer: Self-pay | Admitting: Family Medicine

## 2021-05-28 ENCOUNTER — Ambulatory Visit (INDEPENDENT_AMBULATORY_CARE_PROVIDER_SITE_OTHER): Payer: Medicare Other | Admitting: Family Medicine

## 2021-05-28 VITALS — BP 118/76 | HR 73 | Temp 98.4°F | Wt 231.2 lb

## 2021-05-28 DIAGNOSIS — I10 Essential (primary) hypertension: Secondary | ICD-10-CM

## 2021-05-28 DIAGNOSIS — E785 Hyperlipidemia, unspecified: Secondary | ICD-10-CM

## 2021-05-28 DIAGNOSIS — N138 Other obstructive and reflux uropathy: Secondary | ICD-10-CM

## 2021-05-28 DIAGNOSIS — R739 Hyperglycemia, unspecified: Secondary | ICD-10-CM | POA: Diagnosis not present

## 2021-05-28 DIAGNOSIS — Z23 Encounter for immunization: Secondary | ICD-10-CM | POA: Diagnosis not present

## 2021-05-28 DIAGNOSIS — N401 Enlarged prostate with lower urinary tract symptoms: Secondary | ICD-10-CM | POA: Diagnosis not present

## 2021-05-28 MED ORDER — ATORVASTATIN CALCIUM 10 MG PO TABS: 10.0000 mg | ORAL_TABLET | Freq: Every day | ORAL | 3 refills | Status: DC

## 2021-05-28 NOTE — Progress Notes (Signed)
   Subjective:    Patient ID: Dwayne Woodard, male    DOB: 10/21/1955, 65 y.o.   MRN: [id number]  HPI Here to follow up. He feels great and his BP is stable. He is fasting for labs today.    Review of Systems  Constitutional: Negative.   Respiratory: Negative.    Cardiovascular: Negative.       Objective:   Physical Exam Constitutional:      Appearance: Normal appearance.  Cardiovascular:     Rate and Rhythm: Normal rate and regular rhythm.     Pulses: Normal pulses.     Heart sounds: Normal heart sounds.  Pulmonary:     Effort: Pulmonary effort is normal.     Breath sounds: Normal breath sounds.  Neurological:     Mental Status: He is alert.          Assessment & Plan:  His HTN is well controlled. We will send him for labs today including a lipid panel.  Gershon Crane, MD

## 2021-05-29 LAB — HEPATIC FUNCTION PANEL
ALT: 21 U/L (ref 0–53)
AST: 24 U/L (ref 0–37)
Albumin: 3.9 g/dL (ref 3.5–5.2)
Alkaline Phosphatase: 73 U/L (ref 39–117)
Bilirubin, Direct: 0.1 mg/dL (ref 0.0–0.3)
Total Bilirubin: 0.4 mg/dL (ref 0.2–1.2)
Total Protein: 6.7 g/dL (ref 6.0–8.3)

## 2021-05-29 LAB — TSH: TSH: 3.6 u[IU]/mL (ref 0.35–5.50)

## 2021-05-29 LAB — CBC WITH DIFFERENTIAL/PLATELET
Basophils Absolute: 0.1 10*3/uL (ref 0.0–0.1)
Basophils Relative: 0.8 % (ref 0.0–3.0)
Eosinophils Absolute: 0.3 10*3/uL (ref 0.0–0.7)
Eosinophils Relative: 4.6 % (ref 0.0–5.0)
HCT: 45.1 % (ref 39.0–52.0)
Hemoglobin: 15.1 g/dL (ref 13.0–17.0)
Lymphocytes Relative: 41.2 % (ref 12.0–46.0)
Lymphs Abs: 2.7 10*3/uL (ref 0.7–4.0)
MCHC: 33.4 g/dL (ref 30.0–36.0)
MCV: 96 fl (ref 78.0–100.0)
Monocytes Absolute: 0.4 10*3/uL (ref 0.1–1.0)
Monocytes Relative: 6.2 % (ref 3.0–12.0)
Neutro Abs: 3.1 10*3/uL (ref 1.4–7.7)
Neutrophils Relative %: 47.2 % (ref 43.0–77.0)
Platelets: 206 10*3/uL (ref 150.0–400.0)
RBC: 4.7 Mil/uL (ref 4.22–5.81)
RDW: 13 % (ref 11.5–15.5)
WBC: 6.5 10*3/uL (ref 4.0–10.5)

## 2021-05-29 LAB — LIPID PANEL
Cholesterol: 165 mg/dL (ref 0–200)
HDL: 49.7 mg/dL (ref >39.00)
LDL Cholesterol: 85 mg/dL (ref 0–99)
NonHDL: 115.41
Total CHOL/HDL Ratio: 3
Triglycerides: 152 mg/dL — ABNORMAL HIGH (ref 0.0–149.0)
VLDL: 30.4 mg/dL (ref 0.0–40.0)

## 2021-05-29 LAB — HEMOGLOBIN A1C: Hgb A1c MFr Bld: 5.7 % (ref 4.6–6.5)

## 2021-05-29 LAB — BASIC METABOLIC PANEL
BUN: 14 mg/dL (ref 6–23)
CO2: 32 mEq/L (ref 19–32)
Calcium: 9.1 mg/dL (ref 8.4–10.5)
Chloride: 102 mEq/L (ref 96–112)
Creatinine, Ser: 1.12 mg/dL (ref 0.40–1.50)
GFR: 69.29 mL/min (ref >60.00)
Glucose, Bld: 85 mg/dL (ref 70–99)
Potassium: 5 mEq/L (ref 3.5–5.1)
Sodium: 139 mEq/L (ref 135–145)

## 2021-05-29 LAB — PSA: PSA: 0.84 ng/mL (ref 0.10–4.00)

## 2021-07-11 ENCOUNTER — Emergency Department (HOSPITAL_COMMUNITY): Payer: Medicare Other

## 2021-07-11 ENCOUNTER — Ambulatory Visit (INDEPENDENT_AMBULATORY_CARE_PROVIDER_SITE_OTHER): Payer: Medicare Other | Admitting: Physician Assistant

## 2021-07-11 ENCOUNTER — Encounter (HOSPITAL_COMMUNITY): Payer: Self-pay

## 2021-07-11 ENCOUNTER — Encounter: Payer: Self-pay | Admitting: Physician Assistant

## 2021-07-11 ENCOUNTER — Emergency Department (HOSPITAL_COMMUNITY)
Admission: EM | Admit: 2021-07-11 | Discharge: 2021-07-11 | Disposition: A | Discharge status: Home / Self Care | Payer: Medicare Other | Attending: Emergency Medicine | Admitting: Emergency Medicine

## 2021-07-11 ENCOUNTER — Other Ambulatory Visit: Payer: Self-pay

## 2021-07-11 VITALS — BP 140/98 | HR 99 | Temp 98.2°F | Ht 70.0 in | Wt 238.0 lb

## 2021-07-11 DIAGNOSIS — Z79899 Other long term (current) drug therapy: Secondary | ICD-10-CM | POA: Diagnosis not present

## 2021-07-11 DIAGNOSIS — R519 Headache, unspecified: Secondary | ICD-10-CM | POA: Diagnosis not present

## 2021-07-11 DIAGNOSIS — M542 Cervicalgia: Secondary | ICD-10-CM | POA: Diagnosis not present

## 2021-07-11 DIAGNOSIS — Z743 Need for continuous supervision: Secondary | ICD-10-CM | POA: Diagnosis not present

## 2021-07-11 DIAGNOSIS — E871 Hypo-osmolality and hyponatremia: Secondary | ICD-10-CM | POA: Diagnosis not present

## 2021-07-11 DIAGNOSIS — H538 Other visual disturbances: Secondary | ICD-10-CM | POA: Diagnosis not present

## 2021-07-11 DIAGNOSIS — R6889 Other general symptoms and signs: Secondary | ICD-10-CM | POA: Diagnosis not present

## 2021-07-11 DIAGNOSIS — F17210 Nicotine dependence, cigarettes, uncomplicated: Secondary | ICD-10-CM | POA: Insufficient documentation

## 2021-07-11 DIAGNOSIS — H579 Unspecified disorder of eye and adnexa: Secondary | ICD-10-CM | POA: Diagnosis not present

## 2021-07-11 DIAGNOSIS — I6782 Cerebral ischemia: Secondary | ICD-10-CM | POA: Insufficient documentation

## 2021-07-11 DIAGNOSIS — R5381 Other malaise: Secondary | ICD-10-CM | POA: Diagnosis not present

## 2021-07-11 DIAGNOSIS — I10 Essential (primary) hypertension: Secondary | ICD-10-CM | POA: Diagnosis not present

## 2021-07-11 DIAGNOSIS — I779 Disorder of arteries and arterioles, unspecified: Secondary | ICD-10-CM | POA: Insufficient documentation

## 2021-07-11 DIAGNOSIS — R42 Dizziness and giddiness: Secondary | ICD-10-CM | POA: Diagnosis not present

## 2021-07-11 DIAGNOSIS — Z20822 Contact with and (suspected) exposure to covid-19: Secondary | ICD-10-CM | POA: Diagnosis not present

## 2021-07-11 DIAGNOSIS — I6523 Occlusion and stenosis of bilateral carotid arteries: Secondary | ICD-10-CM | POA: Diagnosis not present

## 2021-07-11 DIAGNOSIS — R404 Transient alteration of awareness: Secondary | ICD-10-CM | POA: Diagnosis not present

## 2021-07-11 LAB — CBC WITH DIFFERENTIAL/PLATELET
Abs Immature Granulocytes: 0.01 10*3/uL (ref 0.00–0.07)
Basophils Absolute: 0.1 10*3/uL (ref 0.0–0.1)
Basophils Relative: 1 %
Eosinophils Absolute: 0.1 10*3/uL (ref 0.0–0.5)
Eosinophils Relative: 1 %
HCT: 46.2 % (ref 39.0–52.0)
Hemoglobin: 15.6 g/dL (ref 13.0–17.0)
Immature Granulocytes: 0 %
Lymphocytes Relative: 34 %
Lymphs Abs: 2.1 10*3/uL (ref 0.7–4.0)
MCH: 32 pg (ref 26.0–34.0)
MCHC: 33.8 g/dL (ref 30.0–36.0)
MCV: 94.7 fL (ref 80.0–100.0)
Monocytes Absolute: 0.4 10*3/uL (ref 0.1–1.0)
Monocytes Relative: 7 %
Neutro Abs: 3.4 10*3/uL (ref 1.7–7.7)
Neutrophils Relative %: 57 %
Platelets: 200 10*3/uL (ref 150–400)
RBC: 4.88 MIL/uL (ref 4.22–5.81)
RDW: 12.1 % (ref 11.5–15.5)
WBC: 6.1 10*3/uL (ref 4.0–10.5)
nRBC: 0 % (ref 0.0–0.2)

## 2021-07-11 LAB — COMPREHENSIVE METABOLIC PANEL
ALT: 27 U/L (ref 0–44)
AST: 29 U/L (ref 15–41)
Albumin: 3.4 g/dL — ABNORMAL LOW (ref 3.5–5.0)
Alkaline Phosphatase: 68 U/L (ref 38–126)
Anion gap: 5 (ref 5–15)
BUN: 7 mg/dL — ABNORMAL LOW (ref 8–23)
CO2: 28 mmol/L (ref 22–32)
Calcium: 8.5 mg/dL — ABNORMAL LOW (ref 8.9–10.3)
Chloride: 99 mmol/L (ref 98–111)
Creatinine, Ser: 1.11 mg/dL (ref 0.61–1.24)
GFR, Estimated: >60 mL/min (ref >60)
Glucose, Bld: 105 mg/dL — ABNORMAL HIGH (ref 70–99)
Potassium: 4.5 mmol/L (ref 3.5–5.1)
Sodium: 132 mmol/L — ABNORMAL LOW (ref 135–145)
Total Bilirubin: 0.5 mg/dL (ref 0.3–1.2)
Total Protein: 6.7 g/dL (ref 6.5–8.1)

## 2021-07-11 LAB — RESP PANEL BY RT-PCR (FLU A&B, COVID) ARPGX2
Influenza A by PCR: NEGATIVE
Influenza B by PCR: NEGATIVE
SARS Coronavirus 2 by RT PCR: NEGATIVE

## 2021-07-11 MED ORDER — CALCIUM CARBONATE 1250 (500 CA) MG PO TABS
1.0000 | ORAL_TABLET | Freq: Once | ORAL | Status: AC
Start: 2021-07-11 — End: 2021-07-11
  Administered 2021-07-11: 500 mg via ORAL
  Filled 2021-07-11: qty 1

## 2021-07-11 MED ORDER — GADOBUTROL 1 MMOL/ML IV SOLN
10.0000 mL | Freq: Once | INTRAVENOUS | Status: AC | PRN
  Administered 2021-07-11: 10 mL via INTRAVENOUS

## 2021-07-11 NOTE — ED Notes (Signed)
Pt transported to MRI 

## 2021-07-11 NOTE — ED Notes (Signed)
Patient verbalizes understanding of discharge instructions. Follow-up care reviewed. Bus pass provided to pt. Opportunity for questioning and answers were provided. Armband removed by staff, pt discharged from ED ambulatory.
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
## 2021-07-11 NOTE — ED Triage Notes (Signed)
Pt BIB [facility] from a dr's office C/O neck pain that started this morning. Pt felt a pop and has blurred vision ever since. Dr.s office called EMS from Chest Pain. Pt denies chest pain at this time. Pt states the pain is 6/10 right now it has eased up but he is lightheaded and has blurred vision.

## 2021-07-11 NOTE — Discharge Instructions (Addendum)
You have been seen and discharged from the emergency department.  Your blood work showed mild hypocalcemia here, you were given a dose of medicine in the department.  Your MRI and MRA of the brain were normal.  No signs of stroke or abnormality that could be causing your symptoms.  It is very important to follow-up with your optometrist to evaluate you are change in vision.  A fluid COVID swab was sent, this was result in the next day.  Follow-up with your primary provider for reevaluation and further care. Take home medications as prescribed. If you have any worsening symptoms or further concerns for your health please return to an emergency department for further evaluation.

## 2021-07-23 ENCOUNTER — Ambulatory Visit: Payer: Medicare Other | Admitting: Family Medicine

## 2021-07-24 ENCOUNTER — Inpatient Hospital Stay: Payer: Medicare Other | Admitting: Family Medicine

## 2021-10-04 ENCOUNTER — Telehealth: Payer: Self-pay | Admitting: Family Medicine

## 2021-10-04 NOTE — Telephone Encounter (Signed)
Left message for patient to call back and schedule Medicare Annual Wellness Visit (AWV) either virtually or in office. Left  my jabber number [phone] ° ° °awvi 08/19/21 per [facility]  °please schedule at anytime with [facility] Nurse Health Advisor 1 or 2 ° ° °This should be a 45 minute visit.  °

## 2021-10-12 ENCOUNTER — Ambulatory Visit (INDEPENDENT_AMBULATORY_CARE_PROVIDER_SITE_OTHER): Payer: Medicare Other

## 2021-10-12 VITALS — BP 120/76 | HR 68 | Temp 97.9°F | Ht 70.0 in | Wt 228.6 lb

## 2021-10-12 DIAGNOSIS — Z0000 Encounter for general adult medical examination without abnormal findings: Secondary | ICD-10-CM | POA: Diagnosis not present

## 2021-10-12 NOTE — Patient Instructions (Addendum)
Dwayne Woodard , Thank you for taking time to come for your Medicare Wellness Visit. I appreciate your ongoing commitment to your health goals. Please review the following plan we discussed and let me know if I can assist you in the future.   These are the goals we discussed:  Goals       Quit Smoking (pt-stated)      Would like to quit smoking, buy a new truck, and increase water intake.        This is a list of the screening recommended for you and due dates:  Health Maintenance  Topic Date Due   HIV Screening  Never done   Colon Cancer Screening  03/23/2018   Pneumonia Vaccine (1 - PCV) 10/12/2022*   Hepatitis C Screening: USPSTF Recommendation to screen - Ages 18-79 yo.  10/12/2022*   Tetanus Vaccine  04/28/2030   Flu Shot  Completed   COVID-19 Vaccine  Completed   Zoster (Shingles) Vaccine  Completed   HPV Vaccine  Aged Out  *Topic was postponed. The date shown is not the original due date.   Advanced directives: Yes Patient will bring copy  Conditions/risks identified: None  Next appointment: Follow up in one year for your annual wellness visit.   Preventive Care 66 Years and Older, Male Preventive care refers to lifestyle choices and visits with your health care provider that can promote health and wellness. What does preventive care include? A yearly physical exam. This is also called an annual well check. Dental exams once or twice a year. Routine eye exams. Ask your health care provider how often you should have your eyes checked. Personal lifestyle choices, including: Daily care of your teeth and gums. Regular physical activity. Eating a healthy diet. Avoiding tobacco and drug use. Limiting alcohol use. Practicing safe sex. Taking low doses of aspirin every day. Taking vitamin and mineral supplements as recommended by your health care provider. What happens during an annual well check? The services and screenings done by your health care provider during your  annual well check will depend on your age, overall health, lifestyle risk factors, and family history of disease. Counseling  Your health care provider may ask you questions about your: Alcohol use. Tobacco use. Drug use. Emotional well-being. Home and relationship well-being. Sexual activity. Eating habits. History of falls. Memory and ability to understand (cognition). Work and work Astronomer. Screening  You may have the following tests or measurements: Height, weight, and BMI. Blood pressure. Lipid and cholesterol levels. These may be checked every 5 years, or more frequently if you are over 41 years old. Skin check. Lung cancer screening. You may have this screening every year starting at age 66 if you have a 30-pack-year history of smoking and currently smoke or have quit within the past 15 years. Fecal occult blood test (FOBT) of the stool. You may have this test every year starting at age 66. Flexible sigmoidoscopy or colonoscopy. You may have a sigmoidoscopy every 5 years or a colonoscopy every 10 years starting at age 66. Prostate cancer screening. Recommendations will vary depending on your family history and other risks. Hepatitis C blood test. Hepatitis B blood test. Sexually transmitted disease (STD) testing. Diabetes screening. This is done by checking your blood sugar (glucose) after you have not eaten for a while (fasting). You may have this done every 1-3 years. Abdominal aortic aneurysm (AAA) screening. You may need this if you are a current or former smoker. Osteoporosis. You may be screened  starting at age 66 if you are at high risk. Talk with your health care provider about your test results, treatment options, and if necessary, the need for more tests. Vaccines  Your health care provider may recommend certain vaccines, such as: Influenza vaccine. This is recommended every year. Tetanus, diphtheria, and acellular pertussis (Tdap, Td) vaccine. You may need a Td  booster every 10 years. Zoster vaccine. You may need this after age 81. Pneumococcal 13-valent conjugate (PCV13) vaccine. One dose is recommended after age 58. Pneumococcal polysaccharide (PPSV23) vaccine. One dose is recommended after age 23. Talk to your health care provider about which screenings and vaccines you need and how often you need them. This information is not intended to replace advice given to you by your health care provider. Make sure you discuss any questions you have with your health care provider. Document Released: 09/01/2015 Document Revised: 04/24/2016 Document Reviewed: 06/06/2015 Elsevier Interactive Patient Education  2017 [employer].  Fall Prevention in the Home Falls can cause injuries. They can happen to people of all ages. There are many things you can do to make your home safe and to help prevent falls. What can I do on the outside of my home? Regularly fix the edges of walkways and driveways and fix any cracks. Remove anything that might make you trip as you walk through a door, such as a raised step or threshold. Trim any bushes or trees on the path to your home. Use bright outdoor lighting. Clear any walking paths of anything that might make someone trip, such as rocks or tools. Regularly check to see if handrails are loose or broken. Make sure that both sides of any steps have handrails. Any raised decks and porches should have guardrails on the edges. Have any leaves, snow, or ice cleared regularly. Use sand or salt on walking paths during winter. Clean up any spills in your garage right away. This includes oil or grease spills. What can I do in the bathroom? Use night lights. Install grab bars by the toilet and in the tub and shower. Do not use towel bars as grab bars. Use non-skid mats or decals in the tub or shower. If you need to sit down in the shower, use a plastic, non-slip stool. Keep the floor dry. Clean up any water that spills on the floor  as soon as it happens. Remove soap buildup in the tub or shower regularly. Attach bath mats securely with double-sided non-slip rug tape. Do not have throw rugs and other things on the floor that can make you trip. What can I do in the bedroom? Use night lights. Make sure that you have a light by your bed that is easy to reach. Do not use any sheets or blankets that are too big for your bed. They should not hang down onto the floor. Have a firm chair that has side arms. You can use this for support while you get dressed. Do not have throw rugs and other things on the floor that can make you trip. What can I do in the kitchen? Clean up any spills right away. Avoid walking on wet floors. Keep items that you use a lot in easy-to-reach places. If you need to reach something above you, use a strong step stool that has a grab bar. Keep electrical cords out of the way. Do not use floor polish or wax that makes floors slippery. If you must use wax, use non-skid floor wax. Do not have throw  rugs and other things on the floor that can make you trip. What can I do with my stairs? Do not leave any items on the stairs. Make sure that there are handrails on both sides of the stairs and use them. Fix handrails that are broken or loose. Make sure that handrails are as long as the stairways. Check any carpeting to make sure that it is firmly attached to the stairs. Fix any carpet that is loose or worn. Avoid having throw rugs at the top or bottom of the stairs. If you do have throw rugs, attach them to the floor with carpet tape. Make sure that you have a light switch at the top of the stairs and the bottom of the stairs. If you do not have them, ask someone to add them for you. What else can I do to help prevent falls? Wear shoes that: Do not have high heels. Have rubber bottoms. Are comfortable and fit you well. Are closed at the toe. Do not wear sandals. If you use a stepladder: Make sure that it is  fully opened. Do not climb a closed stepladder. Make sure that both sides of the stepladder are locked into place. Ask someone to hold it for you, if possible. Clearly mark and make sure that you can see: Any grab bars or handrails. First and last steps. Where the edge of each step is. Use tools that help you move around (mobility aids) if they are needed. These include: Canes. Walkers. Scooters. Crutches. Turn on the lights when you go into a dark area. Replace any light bulbs as soon as they burn out. Set up your furniture so you have a clear path. Avoid moving your furniture around. If any of your floors are uneven, fix them. If there are any pets around you, be aware of where they are. Review your medicines with your doctor. Some medicines can make you feel dizzy. This can increase your chance of falling. Ask your doctor what other things that you can do to help prevent falls. This information is not intended to replace advice given to you by your health care provider. Make sure you discuss any questions you have with your health care provider. Document Released: 06/01/2009 Document Revised: 01/11/2016 Document Reviewed: 09/09/2014 Elsevier Interactive Patient Education  2017 [employer].
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
## 2021-10-23 ENCOUNTER — Other Ambulatory Visit: Payer: Self-pay | Admitting: Family Medicine

## 2021-10-28 ENCOUNTER — Other Ambulatory Visit: Payer: Self-pay | Admitting: Family Medicine

## 2021-10-29 NOTE — Telephone Encounter (Signed)
Last OV-05/28/2021 ?Last refill- 05/01/2021-90 tabs, 5 refills ? ?No future OV scheduled ?

## 2022-02-12 ENCOUNTER — Other Ambulatory Visit: Payer: Self-pay | Admitting: Family Medicine

## 2022-04-08 ENCOUNTER — Other Ambulatory Visit: Payer: Self-pay | Admitting: Family Medicine

## 2022-04-08 NOTE — Telephone Encounter (Signed)
Last OV- 05/28/21 Last refill- 10/30/21--90 tabs, 5 refills  No future OV scheduled.

## 2022-05-23 ENCOUNTER — Other Ambulatory Visit: Payer: Self-pay | Admitting: Family Medicine

## 2022-06-18 ENCOUNTER — Other Ambulatory Visit: Payer: Self-pay | Admitting: Family Medicine

## 2022-06-18 NOTE — Telephone Encounter (Signed)
Pt needs a physical for further refills. 

## 2022-07-03 ENCOUNTER — Other Ambulatory Visit: Payer: Self-pay | Admitting: Family Medicine

## 2022-07-27 ENCOUNTER — Other Ambulatory Visit: Payer: Self-pay | Admitting: Family Medicine

## 2022-07-31 ENCOUNTER — Other Ambulatory Visit: Payer: Self-pay | Admitting: Family Medicine

## 2022-08-01 NOTE — Telephone Encounter (Signed)
Left pt a message advising to call the office and schedule appointment for CPE/ Medication refill

## 2022-08-02 NOTE — Telephone Encounter (Signed)
Spoke with patient, he stated that at this time he is not in need of refills.   Will call back in 1 week to scheduled physical

## 2022-09-03 ENCOUNTER — Ambulatory Visit (INDEPENDENT_AMBULATORY_CARE_PROVIDER_SITE_OTHER): Payer: Medicare Other | Admitting: Family Medicine

## 2022-09-03 ENCOUNTER — Encounter: Payer: Self-pay | Admitting: Family Medicine

## 2022-09-03 VITALS — BP 112/70 | HR 56 | Temp 98.0°F | Ht 70.0 in | Wt 212.0 lb

## 2022-09-03 DIAGNOSIS — N138 Other obstructive and reflux uropathy: Secondary | ICD-10-CM

## 2022-09-03 DIAGNOSIS — N401 Enlarged prostate with lower urinary tract symptoms: Secondary | ICD-10-CM

## 2022-09-03 DIAGNOSIS — I10 Essential (primary) hypertension: Secondary | ICD-10-CM

## 2022-09-03 DIAGNOSIS — E785 Hyperlipidemia, unspecified: Secondary | ICD-10-CM

## 2022-09-03 DIAGNOSIS — K219 Gastro-esophageal reflux disease without esophagitis: Secondary | ICD-10-CM | POA: Diagnosis not present

## 2022-09-03 DIAGNOSIS — M545 Low back pain, unspecified: Secondary | ICD-10-CM | POA: Diagnosis not present

## 2022-09-03 DIAGNOSIS — F418 Other specified anxiety disorders: Secondary | ICD-10-CM | POA: Diagnosis not present

## 2022-09-03 DIAGNOSIS — G8929 Other chronic pain: Secondary | ICD-10-CM | POA: Insufficient documentation

## 2022-09-03 DIAGNOSIS — G4733 Obstructive sleep apnea (adult) (pediatric): Secondary | ICD-10-CM

## 2022-09-03 DIAGNOSIS — R739 Hyperglycemia, unspecified: Secondary | ICD-10-CM | POA: Diagnosis not present

## 2022-09-03 DIAGNOSIS — Z23 Encounter for immunization: Secondary | ICD-10-CM | POA: Diagnosis not present

## 2022-09-03 LAB — BASIC METABOLIC PANEL
BUN: 13 mg/dL (ref 6–23)
CO2: 31 mEq/L (ref 19–32)
Calcium: 9.1 mg/dL (ref 8.4–10.5)
Chloride: 99 mEq/L (ref 96–112)
Creatinine, Ser: 1.06 mg/dL (ref 0.40–1.50)
GFR: 73.36 mL/min (ref >60.00)
Glucose, Bld: 95 mg/dL (ref 70–99)
Potassium: 4.7 mEq/L (ref 3.5–5.1)
Sodium: 138 mEq/L (ref 135–145)

## 2022-09-03 LAB — CBC WITH DIFFERENTIAL/PLATELET
Basophils Absolute: 0 10*3/uL (ref 0.0–0.1)
Basophils Relative: 0.8 % (ref 0.0–3.0)
Eosinophils Absolute: 0.3 10*3/uL (ref 0.0–0.7)
Eosinophils Relative: 4.7 % (ref 0.0–5.0)
HCT: 44.4 % (ref 39.0–52.0)
Hemoglobin: 15 g/dL (ref 13.0–17.0)
Lymphocytes Relative: 32.2 % (ref 12.0–46.0)
Lymphs Abs: 1.8 10*3/uL (ref 0.7–4.0)
MCHC: 33.7 g/dL (ref 30.0–36.0)
MCV: 94.7 fl (ref 78.0–100.0)
Monocytes Absolute: 0.5 10*3/uL (ref 0.1–1.0)
Monocytes Relative: 9.1 % (ref 3.0–12.0)
Neutro Abs: 3 10*3/uL (ref 1.4–7.7)
Neutrophils Relative %: 53.2 % (ref 43.0–77.0)
Platelets: 224 10*3/uL (ref 150.0–400.0)
RBC: 4.69 Mil/uL (ref 4.22–5.81)
RDW: 12.9 % (ref 11.5–15.5)
WBC: 5.7 10*3/uL (ref 4.0–10.5)

## 2022-09-03 LAB — LIPID PANEL
Cholesterol: 154 mg/dL (ref 0–200)
HDL: 54.5 mg/dL (ref >39.00)
LDL Cholesterol: 73 mg/dL (ref 0–99)
NonHDL: 99.7
Total CHOL/HDL Ratio: 3
Triglycerides: 132 mg/dL (ref 0.0–149.0)
VLDL: 26.4 mg/dL (ref 0.0–40.0)

## 2022-09-03 LAB — HEPATIC FUNCTION PANEL
ALT: 15 U/L (ref 0–53)
AST: 21 U/L (ref 0–37)
Albumin: 3.9 g/dL (ref 3.5–5.2)
Alkaline Phosphatase: 76 U/L (ref 39–117)
Bilirubin, Direct: 0.1 mg/dL (ref 0.0–0.3)
Total Bilirubin: 0.5 mg/dL (ref 0.2–1.2)
Total Protein: 6.9 g/dL (ref 6.0–8.3)

## 2022-09-03 LAB — PSA: PSA: 1.19 ng/mL (ref 0.10–4.00)

## 2022-09-03 LAB — HEMOGLOBIN A1C: Hgb A1c MFr Bld: 5.7 % (ref 4.6–6.5)

## 2022-09-03 LAB — TSH: TSH: 4.65 u[IU]/mL (ref 0.35–5.50)

## 2022-09-03 MED ORDER — DICLOFENAC SODIUM 75 MG PO TBEC: 75.0000 mg | DELAYED_RELEASE_TABLET | Freq: Two times a day (BID) | ORAL | 5 refills | Status: DC | PRN

## 2022-09-03 MED ORDER — ALPRAZOLAM 1 MG PO TABS: 1.0000 mg | ORAL_TABLET | Freq: Three times a day (TID) | ORAL | 1 refills | Status: DC | PRN

## 2022-09-03 MED ORDER — ATORVASTATIN CALCIUM 10 MG PO TABS: 10.0000 mg | ORAL_TABLET | Freq: Every day | ORAL | 3 refills | Status: DC

## 2022-09-03 MED ORDER — METOPROLOL SUCCINATE ER 25 MG PO TB24
25.0000 mg | ORAL_TABLET | Freq: Every day | ORAL | 3 refills | Status: DC
Start: 2022-09-03 — End: 2023-09-10

## 2022-09-03 MED ORDER — ESCITALOPRAM OXALATE 10 MG PO TABS: 10.0000 mg | ORAL_TABLET | Freq: Every day | ORAL | 3 refills | Status: DC

## 2022-09-03 NOTE — Addendum Note (Signed)
Addended by: Wyvonne Lenz on: 09/03/2022 09:10 AM   Modules accepted: Orders

## 2022-09-03 NOTE — Progress Notes (Signed)
Subjective:    Patient ID: Dwayne Woodard, male    DOB: 05-09-1956, 67 y.o.   MRN: [id number]  HPI Here to follow up on issues. He is doing well in general, but he does describe low back pains that come and go. These are worst when he does something physical. Like cutting wood. No pain in the legs. He has tried Robaxin and this helps, but it makes him very sleepy. His BP is stable. He tried to watch his diet. His GERD is stable. His moods are stable.    Review of Systems  Constitutional: Negative.   HENT: Negative.    Eyes: Negative.   Respiratory: Negative.    Cardiovascular: Negative.   Gastrointestinal: Negative.   Genitourinary: Negative.   Musculoskeletal: Negative.   Skin: Negative.   Neurological: Negative.   Psychiatric/Behavioral: Negative.         Objective:   Physical Exam Constitutional:      General: He is not in acute distress.    Appearance: Normal appearance. He is well-developed. He is not diaphoretic.  HENT:     Head: Normocephalic and atraumatic.     Right Ear: External ear normal.     Left Ear: External ear normal.     Nose: Nose normal.     Mouth/Throat:     Pharynx: No oropharyngeal exudate.  Eyes:     General: No scleral icterus.       Right eye: No discharge.        Left eye: No discharge.     Conjunctiva/sclera: Conjunctivae normal.     Pupils: Pupils are equal, round, and reactive to light.  Neck:     Thyroid: No thyromegaly.     Vascular: No JVD.     Trachea: No tracheal deviation.  Cardiovascular:     Rate and Rhythm: Normal rate and regular rhythm.     Heart sounds: Normal heart sounds. No murmur heard.    No friction rub. No gallop.  Pulmonary:     Effort: Pulmonary effort is normal. No respiratory distress.     Breath sounds: Normal breath sounds. No wheezing or rales.  Chest:     Chest wall: No tenderness.  Abdominal:     General: Bowel sounds are normal. There is no distension.     Palpations: Abdomen is soft. There is no  mass.     Tenderness: There is no abdominal tenderness. There is no guarding or rebound.  Genitourinary:    Penis: Normal. No tenderness.      Testes: Normal.  Musculoskeletal:        General: No tenderness. Normal range of motion.     Cervical back: Neck supple.  Lymphadenopathy:     Cervical: No cervical adenopathy.  Skin:    General: Skin is warm and dry.     Coloration: Skin is not pale.     Findings: No erythema or rash.  Neurological:     Mental Status: He is alert and oriented to person, place, and time.     Cranial Nerves: No cranial nerve deficit.     Motor: No abnormal muscle tone.     Coordination: Coordination normal.     Deep Tendon Reflexes: Reflexes are normal and symmetric. Reflexes normal.  Psychiatric:        Behavior: Behavior normal.        Thought Content: Thought content normal.        Judgment: Judgment normal.  Assessment & Plan:  His HTN and GERD are stable. His depression and anxiety are stable. He will try Diclofenac as needed for back pain. Get fasting labs to check lipids, etc. We spent a total of ( 34  ) minutes reviewing records and discussing these issues.  Alysia Penna, MD

## 2022-10-11 ENCOUNTER — Telehealth: Payer: Self-pay | Admitting: Family Medicine

## 2022-10-11 NOTE — Telephone Encounter (Signed)
Pt is calling and would like blood work results 

## 2022-10-11 NOTE — Telephone Encounter (Signed)
Returned pt call, left a message to call the office back

## 2022-10-16 ENCOUNTER — Telehealth: Payer: Self-pay | Admitting: Family Medicine

## 2022-10-16 NOTE — Telephone Encounter (Signed)
Last OV-09/03/22.  Pharmacy updated to [facility]

## 2022-10-16 NOTE — Telephone Encounter (Signed)
Pt do not want to come in and he is unable to drive. Pt has headache x 2 days and cough with discoloration phlegm x 3 days and pt would like cough med and abx send to  [id number] [place]  [address]: [phone] Fax: [phone]

## 2022-10-17 MED ORDER — DOXYCYCLINE HYCLATE 100 MG PO CAPS: 100.0000 mg | ORAL_CAPSULE | Freq: Two times a day (BID) | ORAL | 0 refills | Status: AC

## 2022-10-17 MED ORDER — HYDROCODONE BIT-HOMATROP MBR 5-1.5 MG/5ML PO SOLN: 5.0000 mL | ORAL | 0 refills | Status: DC | PRN

## 2022-10-17 NOTE — Telephone Encounter (Signed)
Done

## 2022-10-18 NOTE — Telephone Encounter (Signed)
Spoke with Aldona Bar step daughter will give message to patient to call back about results.

## 2022-10-18 NOTE — Telephone Encounter (Signed)
Left pt a message regarding Rx sent to his pharmacy by Dr Sarajane Jews

## 2022-10-25 ENCOUNTER — Ambulatory Visit (INDEPENDENT_AMBULATORY_CARE_PROVIDER_SITE_OTHER): Payer: Medicare Other

## 2022-10-25 VITALS — BP 134/64 | HR 64 | Temp 98.7°F | Ht 70.0 in | Wt 203.7 lb

## 2022-10-25 DIAGNOSIS — Z0000 Encounter for general adult medical examination without abnormal findings: Secondary | ICD-10-CM

## 2022-10-25 NOTE — Patient Instructions (Addendum)
Dwayne Woodard , Thank you for taking time to come for your Medicare Wellness Visit. I appreciate your ongoing commitment to your health goals. Please review the following plan we discussed and let me know if I can assist you in the future.   These are the goals we discussed:  Goals       Patient stated (pt-stated)      Get rid of my grown children out of my house.      Quit Smoking (pt-stated)      Would like to quit smoking, buy a new truck, and increase water intake.        This is a list of the screening recommended for you and due dates:  Health Maintenance  Topic Date Due   COVID-19 Vaccine (5 - 2023-24 season) 09/19/2023*   Screening for Lung Cancer  10/25/2023*   Colon Cancer Screening  10/25/2023*   Hepatitis C Screening: USPSTF Recommendation to screen - Ages 18-79 yo.  10/25/2023*   Medicare Annual Wellness Visit  10/25/2023   DTaP/Tdap/Td vaccine (3 - Td or Tdap) 04/28/2030   Pneumonia Vaccine  Completed   Flu Shot  Completed   Zoster (Shingles) Vaccine  Completed   HPV Vaccine  Aged Out  *Topic was postponed. The date shown is not the original due date.  Opioid Pain Medicine Management Opioids are powerful medicines that are used to treat moderate to severe pain. When used for short periods of time, they can help you to: Sleep better. Do better in physical or occupational therapy. Feel better in the first few days after an injury. Recover from surgery. Opioids should be taken with the supervision of a trained health care provider. They should be taken for the shortest period of time possible. This is because opioids can be addictive, and the longer you take opioids, the greater your risk of addiction. This addiction can also be called opioid use disorder. What are the risks? Using opioid pain medicines for longer than 3 days increases your risk of side effects. Side effects include: Constipation. Nausea and vomiting. Breathing difficulties (respiratory  depression). Drowsiness. Confusion. Opioid use disorder. Itching. Taking opioid pain medicine for a long period of time can affect your ability to do daily tasks. It also puts you at risk for: Motor vehicle crashes. Depression. Suicide. Heart attack. Overdose, which can be life-threatening. What is a pain treatment plan? A pain treatment plan is an agreement between you and your health care provider. Pain is unique to each person, and treatments vary depending on your condition. To manage your pain, you and your health care provider need to work together. To help you do this: Discuss the goals of your treatment, including how much pain you might expect to have and how you will manage the pain. Review the risks and benefits of taking opioid medicines. Remember that a good treatment plan uses more than one approach and minimizes the chance of side effects. Be honest about the amount of medicines you take and about any drug or alcohol use. Get pain medicine prescriptions from only one health care provider. Pain can be managed with many types of alternative treatments. Ask your health care provider to refer you to one or more specialists who can help you manage pain through: Physical or occupational therapy. Counseling (cognitive behavioral therapy). Good nutrition. Biofeedback. Massage. Meditation. Non-opioid medicine. Following a gentle exercise program. How to use opioid pain medicine Taking medicine Take your pain medicine exactly as told by your health care  provider. Take it only when you need it. If your pain gets less severe, you may take less than your prescribed dose if your health care provider approves. If you are not having pain, do nottake pain medicine unless your health care provider tells you to take it. If your pain is severe, do nottry to treat it yourself by taking more pills than instructed on your prescription. Contact your health care provider for help. Write down  the times when you take your pain medicine. It is easy to become confused while on pain medicine. Writing the time can help you avoid overdose. Take other over-the-counter or prescription medicines only as told by your health care provider. Keeping yourself and others safe  While you are taking opioid pain medicine: Do not drive, use machinery, or power tools. Do not sign legal documents. Do not drink alcohol. Do not take sleeping pills. Do not supervise children by yourself. Do not do activities that require climbing or being in high places. Do not go to a lake, river, ocean, spa, or swimming pool. Do not share your pain medicine with anyone. Keep pain medicine in a locked cabinet or in a secure area where pets and children cannot reach it. Stopping your use of opioids If you have been taking opioid medicine for more than a few weeks, you may need to slowly decrease (taper) how much you take until you stop completely. Tapering your use of opioids can decrease your risk of symptoms of withdrawal, such as: Pain and cramping in the abdomen. Nausea. Sweating. Sleepiness. Restlessness. Uncontrollable shaking (tremors). Cravings for the medicine. Do not attempt to taper your use of opioids on your own. Talk with your health care provider about how to do this. Your health care provider may prescribe a step-down schedule based on how much medicine you are taking and how long you have been taking it. Getting rid of leftover pills Do not save any leftover pills. Get rid of leftover pills safely by: Taking the medicine to a prescription take-back program. This is usually offered by the county or law enforcement. Bringing them to a pharmacy that has a drug disposal container. Flushing them down the toilet. Check the label or package insert of your medicine to see whether this is safe to do. Throwing them out in the trash. Check the label or package insert of your medicine to see whether this is  safe to do. If it is safe to throw it out, remove the medicine from the original container, put it into a sealable bag or container, and mix it with used coffee grounds, food scraps, dirt, or cat litter before putting it in the trash. Follow these instructions at home: Activity Do exercises as told by your health care provider. Avoid activities that make your pain worse. Return to your normal activities as told by your health care provider. Ask your health care provider what activities are safe for you. General instructions You may need to take these actions to prevent or treat constipation: Drink enough fluid to keep your urine pale yellow. Take over-the-counter or prescription medicines. Eat foods that are high in fiber, such as beans, whole grains, and fresh fruits and vegetables. Limit foods that are high in fat and processed sugars, such as fried or sweet foods. Keep all follow-up visits. This is important. Where to find support If you have been taking opioids for a long time, you may benefit from receiving support for quitting from a local support group or counselor.  Ask your health care provider for a referral to these resources in your area. Where to find more information Centers for Disease Control and Prevention (CDC): http://www.wolf.info/ [place] Food and Drug Administration (FDA): [place] Get help right away if: You may have taken too much of an opioid (overdosed). Common symptoms of an overdose: Your breathing is slower or more shallow than normal. You have a very slow heartbeat (pulse). You have slurred speech. You have nausea and vomiting. Your pupils become very small. You have other potential symptoms: You are very confused. You faint or feel like you will faint. You have cold, clammy skin. You have blue lips or fingernails. You have thoughts of harming yourself or harming others. These symptoms may represent a serious problem that is an emergency. Do not wait to see if the  symptoms will go away. Get medical help right away. Call your local emergency services (911 in the [place] Do not drive yourself to the hospital.  If you ever feel like you may hurt yourself or others, or have thoughts about taking your own life, get help right away. Go to your nearest emergency department or: Call your local emergency services (911 in the [place] Call the [facility] 323 087 8893 in the [place] Call a suicide crisis helpline, such as the [place] at (770) 048-5113 or 988 in the [place]. This is open 24 hours a day in the [place] Text the Crisis Text Line at 754-100-8862 (in the [place].). Summary Opioid medicines can help you manage moderate to severe pain for a short period of time. A pain treatment plan is an agreement between you and your health care provider. Discuss the goals of your treatment, including how much pain you might expect to have and how you will manage the pain. If you think that you or someone else may have taken too much of an opioid, get medical help right away. This information is not intended to replace advice given to you by your health care provider. Make sure you discuss any questions you have with your health care provider. Document Revised: 02/28/2021 Document Reviewed: 11/15/2020 Elsevier Patient Education  [place] directives: Please bring a copy of your health care power of attorney and living will to the office to be added to your chart at your convenience.   Conditions/risks identified: None  Next appointment: Follow up in one year for your annual wellness visit.    Preventive Care 41 Years and Older, Male  Preventive care refers to lifestyle choices and visits with your health care provider that can promote health and wellness. What does preventive care include? A yearly physical exam. This is also called an annual well check. Dental exams once or twice a year. Routine eye exams. Ask  your health care provider how often you should have your eyes checked. Personal lifestyle choices, including: Daily care of your teeth and gums. Regular physical activity. Eating a healthy diet. Avoiding tobacco and drug use. Limiting alcohol use. Practicing safe sex. Taking low doses of aspirin every day. Taking vitamin and mineral supplements as recommended by your health care provider. What happens during an annual well check? The services and screenings done by your health care provider during your annual well check will depend on your age, overall health, lifestyle risk factors, and family history of disease. Counseling  Your health care provider may ask you questions about your: Alcohol use. Tobacco use. Drug use. Emotional well-being. Home and relationship well-being. Sexual activity.  Eating habits. History of falls. Memory and ability to understand (cognition). Work and work Statistician. Screening  You may have the following tests or measurements: Height, weight, and BMI. Blood pressure. Lipid and cholesterol levels. These may be checked every 5 years, or more frequently if you are over 19 years old. Skin check. Lung cancer screening. You may have this screening every year starting at age 72 if you have a 30-pack-year history of smoking and currently smoke or have quit within the past 15 years. Fecal occult blood test (FOBT) of the stool. You may have this test every year starting at age 58. Flexible sigmoidoscopy or colonoscopy. You may have a sigmoidoscopy every 5 years or a colonoscopy every 10 years starting at age 37. Prostate cancer screening. Recommendations will vary depending on your family history and other risks. Hepatitis C blood test. Hepatitis B blood test. Sexually transmitted disease (STD) testing. Diabetes screening. This is done by checking your blood sugar (glucose) after you have not eaten for a while (fasting). You may have this done every 1-3  years. Abdominal aortic aneurysm (AAA) screening. You may need this if you are a current or former smoker. Osteoporosis. You may be screened starting at age 29 if you are at high risk. Talk with your health care provider about your test results, treatment options, and if necessary, the need for more tests. Vaccines  Your health care provider may recommend certain vaccines, such as: Influenza vaccine. This is recommended every year. Tetanus, diphtheria, and acellular pertussis (Tdap, Td) vaccine. You may need a Td booster every 10 years. Zoster vaccine. You may need this after age 56. Pneumococcal 13-valent conjugate (PCV13) vaccine. One dose is recommended after age 53. Pneumococcal polysaccharide (PPSV23) vaccine. One dose is recommended after age 52. Talk to your health care provider about which screenings and vaccines you need and how often you need them. This information is not intended to replace advice given to you by your health care provider. Make sure you discuss any questions you have with your health care provider. Document Released: 09/01/2015 Document Revised: 04/24/2016 Document Reviewed: 06/06/2015 Elsevier Interactive Patient Education  2017 [place] Prevention in the Home Falls can cause injuries. They can happen to people of all ages. There are many things you can do to make your home safe and to help prevent falls. What can I do on the outside of my home? Regularly fix the edges of walkways and driveways and fix any cracks. Remove anything that might make you trip as you walk through a door, such as a raised step or threshold. Trim any bushes or trees on the path to your home. Use bright outdoor lighting. Clear any walking paths of anything that might make someone trip, such as rocks or tools. Regularly check to see if handrails are loose or broken. Make sure that both sides of any steps have handrails. Any raised decks and porches should have guardrails on the  edges. Have any leaves, snow, or ice cleared regularly. Use sand or salt on walking paths during winter. Clean up any spills in your garage right away. This includes oil or grease spills. What can I do in the bathroom? Use night lights. Install grab bars by the toilet and in the tub and shower. Do not use towel bars as grab bars. Use non-skid mats or decals in the tub or shower. If you need to sit down in the shower, use a plastic, non-slip stool. Keep the floor dry. Clean  up any water that spills on the floor as soon as it happens. Remove soap buildup in the tub or shower regularly. Attach bath mats securely with double-sided non-slip rug tape. Do not have throw rugs and other things on the floor that can make you trip. What can I do in the bedroom? Use night lights. Make sure that you have a light by your bed that is easy to reach. Do not use any sheets or blankets that are too big for your bed. They should not hang down onto the floor. Have a firm chair that has side arms. You can use this for support while you get dressed. Do not have throw rugs and other things on the floor that can make you trip. What can I do in the kitchen? Clean up any spills right away. Avoid walking on wet floors. Keep items that you use a lot in easy-to-reach places. If you need to reach something above you, use a strong step stool that has a grab bar. Keep electrical cords out of the way. Do not use floor polish or wax that makes floors slippery. If you must use wax, use non-skid floor wax. Do not have throw rugs and other things on the floor that can make you trip. What can I do with my stairs? Do not leave any items on the stairs. Make sure that there are handrails on both sides of the stairs and use them. Fix handrails that are broken or loose. Make sure that handrails are as long as the stairways. Check any carpeting to make sure that it is firmly attached to the stairs. Fix any carpet that is loose or  worn. Avoid having throw rugs at the top or bottom of the stairs. If you do have throw rugs, attach them to the floor with carpet tape. Make sure that you have a light switch at the top of the stairs and the bottom of the stairs. If you do not have them, ask someone to add them for you. What else can I do to help prevent falls? Wear shoes that: Do not have high heels. Have rubber bottoms. Are comfortable and fit you well. Are closed at the toe. Do not wear sandals. If you use a stepladder: Make sure that it is fully opened. Do not climb a closed stepladder. Make sure that both sides of the stepladder are locked into place. Ask someone to hold it for you, if possible. Clearly mark and make sure that you can see: Any grab bars or handrails. First and last steps. Where the edge of each step is. Use tools that help you move around (mobility aids) if they are needed. These include: Canes. Walkers. Scooters. Crutches. Turn on the lights when you go into a dark area. Replace any light bulbs as soon as they burn out. Set up your furniture so you have a clear path. Avoid moving your furniture around. If any of your floors are uneven, fix them. If there are any pets around you, be aware of where they are. Review your medicines with your doctor. Some medicines can make you feel dizzy. This can increase your chance of falling. Ask your doctor what other things that you can do to help prevent falls. This information is not intended to replace advice given to you by your health care provider. Make sure you discuss any questions you have with your health care provider. Document Released: 06/01/2009 Document Revised: 01/11/2016 Document Reviewed: 09/09/2014 Elsevier Interactive Patient Education  2017  [employer].
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
## 2023-03-12 ENCOUNTER — Other Ambulatory Visit: Payer: Self-pay | Admitting: Family Medicine

## 2023-03-13 NOTE — Telephone Encounter (Signed)
Pt LOV was on 09/03/22 Last refill was done on 09/03/22 Please advise

## 2023-04-01 ENCOUNTER — Other Ambulatory Visit: Payer: Self-pay | Admitting: Family Medicine

## 2023-08-21 ENCOUNTER — Ambulatory Visit: Payer: Medicare HMO | Admitting: Family

## 2023-08-21 ENCOUNTER — Telehealth: Payer: Self-pay | Admitting: Family Medicine

## 2023-08-21 ENCOUNTER — Encounter: Payer: Self-pay | Admitting: Family

## 2023-08-21 VITALS — BP 125/82 | HR 66 | Temp 97.5°F | Ht 70.0 in | Wt 204.0 lb

## 2023-08-21 DIAGNOSIS — R21 Rash and other nonspecific skin eruption: Secondary | ICD-10-CM | POA: Diagnosis not present

## 2023-08-21 MED ORDER — VALACYCLOVIR HCL 1 G PO TABS: 1000.0000 mg | ORAL_TABLET | Freq: Three times a day (TID) | ORAL | 0 refills | Status: DC

## 2023-08-21 MED ORDER — PERMETHRIN 5 % EX CREA
1.0000 | TOPICAL_CREAM | Freq: Once | CUTANEOUS | 1 refills | Status: AC
Start: 2023-08-21 — End: 2023-08-21

## 2023-08-21 MED ORDER — HYDROXYZINE HCL 25 MG PO TABS
25.0000 mg | ORAL_TABLET | Freq: Three times a day (TID) | ORAL | 0 refills | Status: DC | PRN
Start: 2023-08-21 — End: 2023-10-28
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
## 2023-08-21 NOTE — Telephone Encounter (Signed)
 Called Pt on 08/21/23 at 8:52 am to reschedule his CPE, due to a scheduling conflict. Pt agreed to come in at 9:30 am - 9:45 am on Wednesday - 09/10/23 for his CPE.

## 2023-08-21 NOTE — Patient Instructions (Addendum)
 It was very nice to see you today!  I believe your rash is caused by the Scabies mite. These get into the fabric of your couch or bedding. Very important for you to wash everything including pillows in hot water to kill these. Disinfect the couch as best you can. I have sent over a cream (Permethrin ) to apply tonight before bedtime and wash off tomorrow. I am sending another cream to apply on the itchiest areas (Triamcinolone). I also am sending a pill to help with the itching, Hydroxyzine . I am sending an antiviral medication, Valtrex  to take just in case you don't get relief after applying the Permethrin  cream. But you also can reapply this in 2 weeks if you still see red spots and are itching. You can start this on Saturday.     PLEASE NOTE:  If you had any lab tests please let us  know if you have not heard back within a few days. You may see your results on MyChart before we have a chance to review them but we will give you a call once they are reviewed by us . If we ordered any referrals today, please let us  know if you have not heard from their office within the next week.

## 2023-08-21 NOTE — Telephone Encounter (Signed)
 Noted.

## 2023-09-09 ENCOUNTER — Encounter: Payer: Medicare HMO | Admitting: Family Medicine

## 2023-09-10 ENCOUNTER — Ambulatory Visit: Payer: Medicare HMO | Admitting: Family Medicine

## 2023-09-10 ENCOUNTER — Encounter: Payer: Self-pay | Admitting: Family Medicine

## 2023-09-10 VITALS — BP 100/62 | HR 89 | Temp 97.7°F | Ht 70.0 in | Wt 204.0 lb

## 2023-09-10 DIAGNOSIS — K219 Gastro-esophageal reflux disease without esophagitis: Secondary | ICD-10-CM

## 2023-09-10 DIAGNOSIS — F418 Other specified anxiety disorders: Secondary | ICD-10-CM | POA: Diagnosis not present

## 2023-09-10 DIAGNOSIS — N138 Other obstructive and reflux uropathy: Secondary | ICD-10-CM

## 2023-09-10 DIAGNOSIS — M545 Low back pain, unspecified: Secondary | ICD-10-CM

## 2023-09-10 DIAGNOSIS — E785 Hyperlipidemia, unspecified: Secondary | ICD-10-CM | POA: Diagnosis not present

## 2023-09-10 DIAGNOSIS — N401 Enlarged prostate with lower urinary tract symptoms: Secondary | ICD-10-CM

## 2023-09-10 DIAGNOSIS — I10 Essential (primary) hypertension: Secondary | ICD-10-CM | POA: Diagnosis not present

## 2023-09-10 DIAGNOSIS — G8929 Other chronic pain: Secondary | ICD-10-CM

## 2023-09-10 DIAGNOSIS — J3089 Other allergic rhinitis: Secondary | ICD-10-CM | POA: Diagnosis not present

## 2023-09-10 DIAGNOSIS — G4733 Obstructive sleep apnea (adult) (pediatric): Secondary | ICD-10-CM | POA: Diagnosis not present

## 2023-09-10 DIAGNOSIS — L509 Urticaria, unspecified: Secondary | ICD-10-CM

## 2023-09-10 DIAGNOSIS — R739 Hyperglycemia, unspecified: Secondary | ICD-10-CM | POA: Diagnosis not present

## 2023-09-10 LAB — BASIC METABOLIC PANEL
BUN: 20 mg/dL (ref 6–23)
CO2: 32 meq/L (ref 19–32)
Calcium: 9.2 mg/dL (ref 8.4–10.5)
Chloride: 100 meq/L (ref 96–112)
Creatinine, Ser: 1.07 mg/dL (ref 0.40–1.50)
GFR: 72.03 mL/min (ref >60.00)
Glucose, Bld: 82 mg/dL (ref 70–99)
Potassium: 4.6 meq/L (ref 3.5–5.1)
Sodium: 139 meq/L (ref 135–145)

## 2023-09-10 LAB — LIPID PANEL
Cholesterol: 217 mg/dL — ABNORMAL HIGH (ref 0–200)
HDL: 51.4 mg/dL (ref >39.00)
LDL Cholesterol: 122 mg/dL — ABNORMAL HIGH (ref 0–99)
NonHDL: 165.17
Total CHOL/HDL Ratio: 4
Triglycerides: 216 mg/dL — ABNORMAL HIGH (ref 0.0–149.0)
VLDL: 43.2 mg/dL — ABNORMAL HIGH (ref 0.0–40.0)

## 2023-09-10 LAB — CBC WITH DIFFERENTIAL/PLATELET
Basophils Absolute: 0.1 10*3/uL (ref 0.0–0.1)
Basophils Relative: 0.7 % (ref 0.0–3.0)
Eosinophils Absolute: 0.3 10*3/uL (ref 0.0–0.7)
Eosinophils Relative: 3.7 % (ref 0.0–5.0)
HCT: 45.9 % (ref 39.0–52.0)
Hemoglobin: 15.3 g/dL (ref 13.0–17.0)
Lymphocytes Relative: 31 % (ref 12.0–46.0)
Lymphs Abs: 2.3 10*3/uL (ref 0.7–4.0)
MCHC: 33.4 g/dL (ref 30.0–36.0)
MCV: 97 fL (ref 78.0–100.0)
Monocytes Absolute: 0.6 10*3/uL (ref 0.1–1.0)
Monocytes Relative: 8.6 % (ref 3.0–12.0)
Neutro Abs: 4.2 10*3/uL (ref 1.4–7.7)
Neutrophils Relative %: 56 % (ref 43.0–77.0)
Platelets: 257 10*3/uL (ref 150.0–400.0)
RBC: 4.73 Mil/uL (ref 4.22–5.81)
RDW: 12.9 % (ref 11.5–15.5)
WBC: 7.4 10*3/uL (ref 4.0–10.5)

## 2023-09-10 LAB — HEMOGLOBIN A1C: Hgb A1c MFr Bld: 5.9 % (ref 4.6–6.5)

## 2023-09-10 LAB — PSA: PSA: 1.88 ng/mL (ref 0.10–4.00)

## 2023-09-10 LAB — HEPATIC FUNCTION PANEL
ALT: 20 U/L (ref 0–53)
AST: 19 U/L (ref 0–37)
Albumin: 4.3 g/dL (ref 3.5–5.2)
Alkaline Phosphatase: 71 U/L (ref 39–117)
Bilirubin, Direct: 0.1 mg/dL (ref 0.0–0.3)
Total Bilirubin: 0.5 mg/dL (ref 0.2–1.2)
Total Protein: 7.6 g/dL (ref 6.0–8.3)

## 2023-09-10 LAB — TSH: TSH: 3.91 u[IU]/mL (ref 0.35–5.50)

## 2023-09-10 MED ORDER — METOPROLOL SUCCINATE ER 25 MG PO TB24: 25.0000 mg | ORAL_TABLET | Freq: Every day | ORAL | 3 refills | Status: DC

## 2023-09-10 MED ORDER — DICLOFENAC SODIUM 75 MG PO TBEC: 75.0000 mg | DELAYED_RELEASE_TABLET | Freq: Two times a day (BID) | ORAL | 3 refills | Status: AC

## 2023-09-10 MED ORDER — PREDNISONE 10 MG PO TABS: ORAL_TABLET | ORAL | 0 refills | Status: DC

## 2023-09-10 MED ORDER — ALPRAZOLAM 1 MG PO TABS: 1.0000 mg | ORAL_TABLET | Freq: Three times a day (TID) | ORAL | 1 refills | Status: DC | PRN

## 2023-09-10 NOTE — Progress Notes (Signed)
Subjective:    Patient ID: Dwayne Woodard, male    DOB: 01/05/1956, 67 y.o.   MRN: [id number]  HPI Here to follow up on issues. His main concern is an itchy rash all over his body that started about 2 months ago. He has taken Benadryl and Zyrtec with no relief. He was seen at another office on 08-21-23 and was diagnosed with scabies. He used 2 bottles of Permethrin 5% lotion, but this did not help. Otherwise his OA pain is abot the same. He gets relief with Diclofenac. His GERD and anxiety and back pain are all stable.    Review of Systems  Constitutional: Negative.   HENT: Negative.    Eyes: Negative.   Respiratory: Negative.    Cardiovascular: Negative.   Gastrointestinal: Negative.   Genitourinary: Negative.   Musculoskeletal:  Positive for arthralgias and back pain.  Skin:  Positive for rash.  Neurological: Negative.   Psychiatric/Behavioral: Negative.         Objective:   Physical Exam Constitutional:      General: He is not in acute distress.    Appearance: Normal appearance. He is well-developed. He is not diaphoretic.  HENT:     Head: Normocephalic and atraumatic.     Right Ear: External ear normal.     Left Ear: External ear normal.     Nose: Nose normal.     Mouth/Throat:     Pharynx: No oropharyngeal exudate.  Eyes:     General: No scleral icterus.       Right eye: No discharge.        Left eye: No discharge.     Conjunctiva/sclera: Conjunctivae normal.     Pupils: Pupils are equal, round, and reactive to light.  Neck:     Thyroid: No thyromegaly.     Vascular: No JVD.     Trachea: No tracheal deviation.  Cardiovascular:     Rate and Rhythm: Normal rate and regular rhythm.     Pulses: Normal pulses.     Heart sounds: Normal heart sounds. No murmur heard.    No friction rub. No gallop.  Pulmonary:     Effort: Pulmonary effort is normal. No respiratory distress.     Breath sounds: Normal breath sounds. No wheezing or rales.  Chest:     Chest wall: No  tenderness.  Abdominal:     General: Bowel sounds are normal. There is no distension.     Palpations: Abdomen is soft. There is no mass.     Tenderness: There is no abdominal tenderness. There is no guarding or rebound.  Genitourinary:    Penis: Normal. No tenderness.      Testes: Normal.  Musculoskeletal:        General: No tenderness. Normal range of motion.     Cervical back: Neck supple.  Lymphadenopathy:     Cervical: No cervical adenopathy.  Skin:    General: Skin is warm and dry.     Comments: Widespread urticarial rash on trunk and extremities   Neurological:     General: No focal deficit present.     Mental Status: He is alert and oriented to person, place, and time.     Cranial Nerves: No cranial nerve deficit.     Motor: No abnormal muscle tone.     Coordination: Coordination normal.     Deep Tendon Reflexes: Reflexes are normal and symmetric. Reflexes normal.  Psychiatric:        Mood and Affect:  Mood normal.        Behavior: Behavior normal.        Thought Content: Thought content normal.        Judgment: Judgment normal.           Assessment & Plan:  He has been dealing with hives, so we will treat this with a long taper of Prednisone, beginning with 50 mg a day. His back pain and OA are stable. His anxiety and GERD are stable. We will get fasting labs to check lipids, etc.  Gershon Crane, MD

## 2023-09-12 ENCOUNTER — Ambulatory Visit: Payer: Self-pay | Admitting: Family Medicine

## 2023-09-12 NOTE — Telephone Encounter (Signed)
Chief Complaint: Medication concern Symptoms: n/a Frequency: n/a Pertinent Negatives: Patient denies n/a Disposition: [] ED /[] Urgent Care (no appt availability in office) / [] Appointment(In office/virtual)/ []  [facility]/ [] Home Care/ [] Refused Recommended Disposition /[] [facility]/ [x]  Follow-up with PCP Additional Notes:      Patient called in with initial concerns that he may run out of Prednisone over the weekend. This RN explained the tapered dosing to patient and his spouse. Spouse stated understanding and that patient is taking medication correctly and patient has correct remaining doses. No further needs at this time.  Protocols used: Information Only Call - No Triage-A-AH    Copied from CRM [id number]. Topic: Clinical - Medication Question >> Sep 12, 2023  5:48 PM Dwayne Woodard wrote: Reason for CRM: Patient has had hives for 4-5 months. Dr. Clent Ridges gave him an Rx for prednisone and he needs a refill. He will be out tomorrow afternoon. Answer Assessment - Initial Assessment Questions 1. REASON FOR CALL or QUESTION: "What is your reason for calling today?" or "How can I best help you?" or "What question do you have that I can help answer?"     Patient called in with initial concerns that he may run out of Prednisone over the weekend. This RN explained the tapered dosing to patient and his spouse. Spouse stated understanding and that patient is taking medication correctly and patient has correct remaining doses. No further needs at this time.  Protocols used: Information Only Call - No Triage-A-AH

## 2023-09-15 ENCOUNTER — Telehealth: Payer: Self-pay

## 2023-09-15 NOTE — Telephone Encounter (Signed)
Copied from CRM [id number]. Topic: Clinical - Medication Question >> Sep 12, 2023  5:38 PM Taleah C wrote: Reason for CRM: pt called and stated that he would like refills for his medication for predniSONE (DELTASONE) 10 MG tablet. He explained that the medicine has been working and he will run out soon as he mentioned that he only has 18 pills left as of today. Please call and advise with patient.

## 2023-09-18 NOTE — Telephone Encounter (Signed)
Pt wants a refill on prednisone for the hives. Please advise

## 2023-09-19 MED ORDER — HYDROCODONE BIT-HOMATROP MBR 5-1.5 MG/5ML PO SOLN: 5.0000 mL | ORAL | 0 refills | Status: DC | PRN

## 2023-09-19 MED ORDER — OSELTAMIVIR PHOSPHATE 75 MG PO CAPS: 75.0000 mg | ORAL_CAPSULE | Freq: Two times a day (BID) | ORAL | 0 refills | Status: DC

## 2023-09-19 NOTE — Telephone Encounter (Signed)
I'm not sure what he took but Tamiflu is only available by prescription. I did send this in along with cough syrup

## 2023-09-19 NOTE — Telephone Encounter (Signed)
Pt notified of Dr Clent Ridges advise. Pt states that he had a home Flu test on Monday. States that he took OTC Tamiflu for 2 days and is still not well. Request for Hydrocodone cough syrup and a prescription to help with the flu. Please advise

## 2023-09-19 NOTE — Addendum Note (Signed)
Addended by: Gershon Crane A on: 09/19/2023 04:34 PM   Modules accepted: Orders

## 2023-09-19 NOTE — Telephone Encounter (Signed)
This was meant to be a one time taper, so he will not need any more Prednisone

## 2023-09-24 ENCOUNTER — Ambulatory Visit: Payer: Self-pay | Admitting: Family Medicine

## 2023-09-24 NOTE — Telephone Encounter (Signed)
 Copied from CRM [id number]. Topic: Clinical - Red Word Triage >> Sep 24, 2023  1:28 PM Montie POUR wrote: Red Word that prompted transfer to Nurse Triage: Hives on both arms, private parts,both legs, looks like blisters, he took the predniSONE  (DELTASONE ) 10 MG tablet incorrectly.  He saw Dr. Johnny about hives on back and they are better. He still has a bad cold.   Chief Complaint: Hives Symptoms: itchy hives on arms, legs, private area Disposition: [] ED /[] Urgent Care (no appt availability in office) / [x] Appointment(In office/virtual)/ []  [place] [facility]/ [] Home Care/ [] Refused Recommended Disposition /[] [place] [facility]/ []  Follow-up with PCP Additional Notes: Patient is experiencing recurrent hives. He was recently seen for hives on back and was given a prescription for  Prednisone  on 1/22. Patient stated he was taking the Prednisone  incorrectly, but the hives on his back still cleared up. He was taking 6 tablets every 4 hours for the first 3 days and then he took 1 or 2 tablets a day until he ran out of the medication. A day after he finished the medication, he started getting hives on other areas of his body. He stated he now has hives on his arms, legs, and private area. Patient also stated that he was given a prescription for Tamiflu , but his cold symptoms have not improved. He will be finished with the Tamiflu  tomorrow. Appointment scheduled for tomorrow morning.   Reason for Disposition  [1] Hives have become worse AND [2] taking oral steroids (e.g., prednisone ) > 24 hours  Answer Assessment - Initial Assessment Questions 1. APPEARANCE: What does the rash look like?      Redness, scabs  2. LOCATION: Where is the rash located?      Arms, legs, private area  3. SIZE: How big are the hives? (inches, cm, compare to coins) Do they all look the same or is there lots of variation in shape and size?      Various sizes  4. ONSET: When did the hives begin? (Hours or days  ago)      A day after finishing the Prednisone  medication   5. ITCHING: Does it itch?       Yes  6. RECURRENT PROBLEM: Have you had hives before? If Yes, ask: When was the last time? and What happened that time?      Yes, last time patient was given a prescription for Prednisone  on 1/22  7. TRIGGERS: Were you exposed to any new food, plant, cosmetic product or animal just before the hives began?     No  8. OTHER SYMPTOMS: Do you have any other symptoms? (e.g., fever, tongue swelling, difficulty breathing, abdomen pain)     Patient is feeling congested due to cold  Protocols used: Hives-A-AH

## 2023-09-25 ENCOUNTER — Ambulatory Visit (INDEPENDENT_AMBULATORY_CARE_PROVIDER_SITE_OTHER): Payer: Medicare HMO | Admitting: Family Medicine

## 2023-09-25 ENCOUNTER — Encounter: Payer: Self-pay | Admitting: Family Medicine

## 2023-09-25 VITALS — BP 98/62 | HR 107 | Temp 97.8°F | Wt 195.0 lb

## 2023-09-25 DIAGNOSIS — B86 Scabies: Secondary | ICD-10-CM

## 2023-09-25 DIAGNOSIS — B349 Viral infection, unspecified: Secondary | ICD-10-CM | POA: Diagnosis not present

## 2023-09-25 MED ORDER — HYDROCODONE BIT-HOMATROP MBR 5-1.5 MG/5ML PO SOLN: 5.0000 mL | ORAL | 0 refills | Status: DC | PRN

## 2023-09-25 MED ORDER — MALATHION 0.5 % EX LOTN: TOPICAL_LOTION | Freq: Once | CUTANEOUS | 0 refills | Status: AC

## 2023-09-25 NOTE — Progress Notes (Signed)
   Subjective:    Patient ID: Dwayne Woodard, male    DOB: Aug 09, 1956, 68 y.o.   MRN: [id number]  HPI Here to follow up on itchy rashes and a recent bout of influenza. He was seen on 08-21-23 with an itchy rash, and this was diagnosed as scabies. He was treated with 5% Permethrin , but this did not help. In fact the scattered red dots turned into whelps over his body, so when we saw him on 09-10-23 he was felt to have hives. He was treated with a Prednisone  taper, and this has helped. Now the whelps are gone, but he still has the original red dots that itch. In addition last week he developed body aches and a cough productive of yellow mucus. He tested positive for flu, so he was given 5 days of Tamiflu . Now he feels much better with only a lingering dry cough. No fever.    Review of Systems  Constitutional: Negative.   HENT: Negative.    Eyes: Negative.   Respiratory:  Positive for cough. Negative for shortness of breath and wheezing.   Skin:  Positive for rash.       Objective:   Physical Exam Constitutional:      Appearance: Normal appearance. He is not ill-appearing.  HENT:     Right Ear: Tympanic membrane, ear canal and external ear normal.     Left Ear: Tympanic membrane, ear canal and external ear normal.     Nose: Nose normal.     Mouth/Throat:     Pharynx: Oropharynx is clear.  Eyes:     Conjunctiva/sclera: Conjunctivae normal.  Pulmonary:     Effort: Pulmonary effort is normal.     Breath sounds: Normal breath sounds.  Lymphadenopathy:     Cervical: No cervical adenopathy.  Skin:    Comments: Widespread tiny red macular dots over the trunk, arms, and legs. There any many excoriation marks as well.   Neurological:     Mental Status: He is alert.           Assessment & Plan:  It seems he developed a scabies infection that was resistant to Permethrin . This then led to hives which a re resolved after a Prednisone  taper, however the scabies remains. We will treat this  with Ovide  lotion. He will apply at bedtime and keep it on overnight. Shower this off the next morning. He also had a recent viral illness that seems to be resolved.  Garnette Olmsted, MD

## 2023-10-01 ENCOUNTER — Other Ambulatory Visit: Payer: Self-pay | Admitting: Family Medicine

## 2023-10-27 ENCOUNTER — Ambulatory Visit: Admitting: Family Medicine

## 2023-10-28 ENCOUNTER — Ambulatory Visit (INDEPENDENT_AMBULATORY_CARE_PROVIDER_SITE_OTHER): Admitting: Family Medicine

## 2023-10-28 ENCOUNTER — Encounter: Payer: Self-pay | Admitting: Family Medicine

## 2023-10-28 VITALS — BP 118/76 | HR 94 | Temp 97.8°F | Wt 202.0 lb

## 2023-10-28 DIAGNOSIS — R21 Rash and other nonspecific skin eruption: Secondary | ICD-10-CM | POA: Diagnosis not present

## 2023-10-28 DIAGNOSIS — M79671 Pain in right foot: Secondary | ICD-10-CM | POA: Diagnosis not present

## 2023-10-28 DIAGNOSIS — M79672 Pain in left foot: Secondary | ICD-10-CM

## 2023-10-28 MED ORDER — HYDROXYZINE HCL 25 MG PO TABS: 25.0000 mg | ORAL_TABLET | ORAL | 2 refills | Status: DC | PRN

## 2023-10-28 MED ORDER — PREDNISONE 10 MG PO TABS
10.0000 mg | ORAL_TABLET | Freq: Two times a day (BID) | ORAL | 1 refills | Status: DC
Start: 2023-10-28 — End: 2024-01-08

## 2023-10-28 NOTE — Progress Notes (Signed)
   Subjective:    Patient ID: Dwayne Woodard, male    DOB: Jun 10, 1956, 68 y.o.   MRN: [id number]  HPI Here for several issues. First he continues to have an itchy rash all over his body. This started about 2 months ago. At first it consisted of discrete tiny red dots which suggested scabies, so he was treated with Malathion lotion. This has not helped. He takes Zyrtec daily. He had been given some Prednisone at one point, and this gave him brief relief. Now the rash is spread more evenly over his skin. The other issue is pain in both feet,especially in the balls of the feet.    Review of Systems  Constitutional: Negative.   Respiratory: Negative.    Cardiovascular: Negative.   Musculoskeletal:  Positive for arthralgias.  Skin:  Positive for rash.       Objective:   Physical Exam Constitutional:      Appearance: Normal appearance.  Cardiovascular:     Rate and Rhythm: Normal rate and regular rhythm.     Pulses: Normal pulses.     Heart sounds: Normal heart sounds.  Pulmonary:     Effort: Pulmonary effort is normal.     Breath sounds: Normal breath sounds.  Skin:    Comments: There is a macular confluent red rash over his trunk and extremities   Neurological:     Mental Status: He is alert.           Assessment & Plan:  He has a rash which is now more consistent with a type of eczema. He will take Prednisone 10 mg BID and use Hydroxyzine as needed. Refer to Dermatology. Also refer to Podiatry for the foot pain.  Gershon Crane, MD

## 2023-11-04 ENCOUNTER — Telehealth: Payer: Self-pay | Admitting: Podiatry

## 2023-11-04 ENCOUNTER — Ambulatory Visit: Admitting: Podiatry

## 2023-11-04 NOTE — Telephone Encounter (Signed)
 Per vm from pt on 3/17 at 1053pm he has appt tomorrow at 915am and he needs to cxl appt.  I returned call and left message for pt to call to r/s. I did cxl appt

## 2023-11-08 ENCOUNTER — Other Ambulatory Visit: Payer: Self-pay | Admitting: Family Medicine

## 2023-11-18 ENCOUNTER — Ambulatory Visit: Admitting: Podiatry

## 2023-11-27 ENCOUNTER — Ambulatory Visit (INDEPENDENT_AMBULATORY_CARE_PROVIDER_SITE_OTHER): Admitting: Podiatry

## 2023-11-27 DIAGNOSIS — Z91199 Patient's noncompliance with other medical treatment and regimen due to unspecified reason: Secondary | ICD-10-CM

## 2023-11-30 NOTE — Progress Notes (Signed)
 Patient was no-show for appointment today

## 2023-12-12 ENCOUNTER — Encounter

## 2024-01-07 ENCOUNTER — Other Ambulatory Visit: Payer: Self-pay | Admitting: Family Medicine

## 2024-02-09 ENCOUNTER — Other Ambulatory Visit: Payer: Self-pay

## 2024-02-09 ENCOUNTER — Ambulatory Visit: Payer: Self-pay

## 2024-02-09 ENCOUNTER — Encounter (HOSPITAL_BASED_OUTPATIENT_CLINIC_OR_DEPARTMENT_OTHER): Payer: Self-pay | Admitting: Emergency Medicine

## 2024-02-09 ENCOUNTER — Emergency Department (HOSPITAL_BASED_OUTPATIENT_CLINIC_OR_DEPARTMENT_OTHER)
Admission: EM | Admit: 2024-02-09 | Discharge: 2024-02-09 | Discharge status: Against Medical Advice | Attending: Emergency Medicine | Admitting: Emergency Medicine

## 2024-02-09 DIAGNOSIS — F17210 Nicotine dependence, cigarettes, uncomplicated: Secondary | ICD-10-CM | POA: Diagnosis not present

## 2024-02-09 DIAGNOSIS — R000 Tachycardia, unspecified: Secondary | ICD-10-CM | POA: Diagnosis not present

## 2024-02-09 DIAGNOSIS — W19XXXA Unspecified fall, initial encounter: Secondary | ICD-10-CM

## 2024-02-09 DIAGNOSIS — Z79899 Other long term (current) drug therapy: Secondary | ICD-10-CM | POA: Insufficient documentation

## 2024-02-09 DIAGNOSIS — Z5329 Procedure and treatment not carried out because of patient's decision for other reasons: Secondary | ICD-10-CM | POA: Insufficient documentation

## 2024-02-09 DIAGNOSIS — R531 Weakness: Secondary | ICD-10-CM | POA: Diagnosis not present

## 2024-02-09 DIAGNOSIS — R42 Dizziness and giddiness: Secondary | ICD-10-CM | POA: Insufficient documentation

## 2024-02-09 DIAGNOSIS — W01190A Fall on same level from slipping, tripping and stumbling with subsequent striking against furniture, initial encounter: Secondary | ICD-10-CM | POA: Insufficient documentation

## 2024-02-09 DIAGNOSIS — I10 Essential (primary) hypertension: Secondary | ICD-10-CM | POA: Insufficient documentation

## 2024-02-09 DIAGNOSIS — R630 Anorexia: Secondary | ICD-10-CM | POA: Diagnosis not present

## 2024-02-09 LAB — TROPONIN T, HIGH SENSITIVITY: Troponin T High Sensitivity: <15 ng/L (ref <19)

## 2024-02-09 NOTE — ED Notes (Signed)
 Pt came from restroom and advised that he was going to be leaving because nothing had been done for him yet. RN advised would follow up but patient walked out of dept. RN manager aware. MD made aware.
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
## 2024-02-09 NOTE — Telephone Encounter (Signed)
 Pt is currently at the ED for this problem

## 2024-02-09 NOTE — ED Triage Notes (Signed)
 Light headed dizzy x 1 week Not eating Fall 3 weeks ago Bruising on right ribs

## 2024-02-09 NOTE — Telephone Encounter (Signed)
 FYI Only or Action Required?: FYI only for provider.  Patient was last seen in primary care on 10/28/2023 by Johnny Garnette LABOR, MD. Called Nurse Triage reporting Fall. Symptoms began a week ago. Interventions attempted: Rest, hydration, or home remedies. Symptoms are: gradually worsening.  Triage Disposition: Go to ED Now (Notify PCP)  Patient/caregiver understands and will follow disposition?: Yes  Copied from CRM [id number]. Topic: Clinical - Red Word Triage >> Feb 09, 2024  9:49 AM [place] J wrote: [facility] that prompted transfer to Nurse Triage: Patient had a fall three weeks ago and believes he cracked a rib. He also stated that ever since the fall happened, he's been experiencing chills, light headedness, no appetite, burning eyes, cold/hot feet and hands, and dizziness. Reason for Disposition  Injury (or injuries) that need emergency care  Answer Assessment - Initial Assessment Questions Pt reports he did not know if he was having withdrawal s/s from his Xanax . Upon further nursing assessment, discovered that after Pt's reported Fall 3 weeks ago, pt states he did not seek any medical attention and reports that he hit his head and right side of torso, pt reports bruising to right side of torso.  Pt is also reporting small boils to under bilat under arms and states does not know when they occurred but was definitely post ladder fall 3 weeks ago. Small boils are only mild soreness, 2/10.  Nurse advising pt to seek ED but pt refuses ED/hospital. Nurse advising pt to seek UC for further evaluation d/t s/s he is having.  At first pt did not want to go and wanted to make appmt with MD Johnny. Nurse explained importance of being evaluated post head injury and with pt's s/s that are occurring.  Closest UC not [place] but pt willing to go to bc it is only 12 minutes away. [facility] [place] - address and phone # given to pt. Pt states will drive to UC and states he is able to drive, wife  not home bc out taking care of sister past month. Nurse explained to please pull over if s/s get worse while driving to stop and pull over and call for EMS and pt verbalizes understanding.   1. MECHANISM: How did the fall happen?     Fell off ladder and landed on R side unders arm 3. ONSET: When did the fall happen? (e.g., minutes, hours, or days ago)     3 weeks ago fell 4. LOCATION: What part of the body hit the ground? (e.g., back, buttocks, head, hips, knees, hands, head, stomach)     Head and right side torso  5. INJURY: Did you hurt (injure) yourself when you fell? If Yes, ask: What did you injure? Tell me more about this? (e.g., body area; type of injury; pain severity)     Pt thinks he hurt a rib bc of bruising to right side torso  6. PAIN: Is there any pain? If Yes, ask: How bad is the pain? (e.g., Scale 1-10; or mild,  moderate, severe)   - NONE (0): No pain   - MILD (1-3): Doesn't interfere with normal activities    - MODERATE (4-7): Interferes with normal activities or awakens from sleep    - SEVERE (8-10): Excruciating pain, unable to do any normal activities      Mild, 2/10 7. SIZE: For cuts, bruises, or swelling, ask: How large is it? (e.g., inches or centimeters)      Bruising to right  torso 9. OTHER SYMPTOMS: Do you have any other symptoms? (e.g., dizziness, fever, weakness; new onset or worsening).      Dizzy, lightheaded, hot/cold chills, hand/feet cramps/tingles/numb, no appetite for past week and especially last 3 days barely eating and have had small emesis episodes, no fevers, no headaches, no chest pain, no difficulty breathing.  Protocols used: Falls and [facility]

## 2024-02-11 ENCOUNTER — Ambulatory Visit (INDEPENDENT_AMBULATORY_CARE_PROVIDER_SITE_OTHER)

## 2024-02-11 ENCOUNTER — Encounter: Payer: Self-pay | Admitting: Family Medicine

## 2024-02-11 ENCOUNTER — Ambulatory Visit (INDEPENDENT_AMBULATORY_CARE_PROVIDER_SITE_OTHER): Admitting: Family Medicine

## 2024-02-11 VITALS — BP 96/62 | HR 87 | Temp 98.2°F | Wt 193.0 lb

## 2024-02-11 DIAGNOSIS — F13930 Sedative, hypnotic or anxiolytic use, unspecified with withdrawal, uncomplicated: Secondary | ICD-10-CM | POA: Diagnosis not present

## 2024-02-11 DIAGNOSIS — S20211D Contusion of right front wall of thorax, subsequent encounter: Secondary | ICD-10-CM

## 2024-02-11 DIAGNOSIS — S2241XA Multiple fractures of ribs, right side, initial encounter for closed fracture: Secondary | ICD-10-CM | POA: Diagnosis not present

## 2024-02-11 MED ORDER — ALPRAZOLAM 1 MG PO TABS: 1.0000 mg | ORAL_TABLET | Freq: Three times a day (TID) | ORAL | 1 refills | Status: DC | PRN
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
## 2024-02-13 ENCOUNTER — Telehealth: Payer: Self-pay

## 2024-02-13 NOTE — Telephone Encounter (Unsigned)
 Copied from CRM [id number]. Topic: Clinical - Lab/Test Results >> Feb 13, 2024  4:03 PM DeAngela L wrote: Reason for CRM: patient calling to get results of the xray he completed yesterday  Pt num [phone] (H) ok to leave a detailed

## 2024-02-16 ENCOUNTER — Telehealth: Payer: Self-pay

## 2024-02-16 NOTE — Telephone Encounter (Signed)
 Copied from CRM [id number]. Topic: Clinical - Lab/Test Results >> Feb 16, 2024  2:18 PM [place] wrote: Reason for CRM: Patient following up to get results of xrays done on 6/23

## 2024-02-18 NOTE — Telephone Encounter (Signed)
 Spoke with radiology department advised to read pt x-ray results and sent the final report to Dr Johnny, stated that they will have that the report soon. Pt notified

## 2024-02-19 ENCOUNTER — Ambulatory Visit: Payer: Self-pay | Admitting: Family Medicine

## 2024-02-19 ENCOUNTER — Telehealth: Payer: Self-pay | Admitting: *Deleted

## 2024-02-19 NOTE — Telephone Encounter (Signed)
 Communication  Reason for CRM: Read to patient verbatim Dr.Fry message. Patient understood message

## 2024-06-18 ENCOUNTER — Ambulatory Visit: Payer: Self-pay

## 2024-06-18 NOTE — Telephone Encounter (Signed)
 Noted

## 2024-06-18 NOTE — Telephone Encounter (Signed)
 FYI Only or Action Required?: FYI only for provider: appointment scheduled on 11/3.  Patient was last seen in primary care on 02/11/2024 by Johnny Garnette LABOR, MD.  Called Nurse Triage reporting Joint Swelling.  Symptoms began a week ago.  Interventions attempted: Nothing.  Symptoms are: unchanged.  Triage Disposition: See PCP When Office is Open (Within 3 Days)  Patient/caregiver understands and will follow disposition?: Yes     Copied from CRM #[id number]. Topic: Clinical - Red Word Triage >> Jun 18, 2024  3:17 PM Berneda FALCON wrote: Red Word that prompted transfer to Nurse Triage: Patient states he has swelling in his right elbow. He states it is called a bursa sack.  Has been swollen for a week-last week he was running a chainsaw.  States there is now pain in the back of the arm, slight itch.      Reason for Disposition  Fluid-filled sack located directly over tip (point) of elbow  Answer Assessment - Initial Assessment Questions 1. LOCATION: Where is the swelling? (e.g., left, right, both elbows)     Right elbow  2. SIZE and DESCRIPTION: What does the swelling look like? (e.g., entire elbow, localized)     Between an golf ball and a tennis ball 3. ONSET: When did the swelling start? Does it come and go, or is it there all the time?     1 week ago  4. WORK OR EXERCISE: Has there been any recent work, exercise or other activity that involved that part of the body?      Was using a chainsaw a week ago before it began  5. AGGRAVATING FACTORS: What makes the elbow swelling worse? (e.g., work, sports activities)     Has not changed  6. ASSOCIATED SYMPTOMS: Is there any pain or redness?     Mild pain and redness  7. OTHER SYMPTOMS: Do you have any other symptoms? (e.g., fever)     No  Protocols used: Elbow Swelling-A-AH

## 2024-06-21 ENCOUNTER — Ambulatory Visit: Admitting: Family Medicine

## 2024-06-21 ENCOUNTER — Encounter: Payer: Self-pay | Admitting: Family Medicine

## 2024-06-21 VITALS — BP 110/60 | HR 95 | Temp 98.1°F | Wt 205.0 lb

## 2024-06-21 DIAGNOSIS — L03113 Cellulitis of right upper limb: Secondary | ICD-10-CM

## 2024-06-21 DIAGNOSIS — M7021 Olecranon bursitis, right elbow: Secondary | ICD-10-CM

## 2024-06-21 MED ORDER — AMOXICILLIN-POT CLAVULANATE 875-125 MG PO TABS: 1.0000 | ORAL_TABLET | Freq: Two times a day (BID) | ORAL | 0 refills | Status: AC

## 2024-06-21 NOTE — Progress Notes (Signed)
   Subjective:    Patient ID: Dwayne Woodard, male    DOB: 1955-11-17, 68 y.o.   MRN: [id number]  HPI Here for one week of swelling and pain in the right elbow. Now for 2 days it has also been red and warm. No fever. Of note we have aspirated the right olecranon bursa aabout 3 times over the past 10 years. He thinks this started after her operated his chainsaw for 4 hours.    Review of Systems  Constitutional: Negative.   Respiratory: Negative.    Cardiovascular: Negative.   Musculoskeletal:  Positive for arthralgias and joint swelling.       Objective:   Physical Exam Constitutional:      Appearance: Normal appearance.  Cardiovascular:     Rate and Rhythm: Normal rate and regular rhythm.     Pulses: Normal pulses.     Heart sounds: Normal heart sounds.  Pulmonary:     Effort: Pulmonary effort is normal.     Breath sounds: Normal breath sounds.  Musculoskeletal:     Comments: Right elbow is swollen. Red, warm, and quite tender. The right olecranon bursa is swollen. ROM is limited by pain   Neurological:     Mental Status: He is alert.           Assessment & Plan:  Right olecranon bursitis along with cellulitis. We will treat with 10 days of Augmentin . Refer to Orthopedics.  Garnette Olmsted, MD

## 2024-06-22 ENCOUNTER — Ambulatory Visit: Admitting: Physician Assistant

## 2024-06-22 ENCOUNTER — Other Ambulatory Visit: Payer: Self-pay

## 2024-06-22 ENCOUNTER — Ambulatory Visit: Admitting: Family

## 2024-06-22 ENCOUNTER — Encounter (HOSPITAL_COMMUNITY): Payer: Self-pay | Admitting: Orthopedic Surgery

## 2024-06-22 DIAGNOSIS — M71121 Other infective bursitis, right elbow: Secondary | ICD-10-CM

## 2024-06-22 MED ORDER — HYDROCODONE-ACETAMINOPHEN 5-325 MG PO TABS: 1.0000 | ORAL_TABLET | Freq: Four times a day (QID) | ORAL | 0 refills | Status: AC | PRN
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
## 2024-06-22 NOTE — Progress Notes (Signed)
 SDW CALL  Patient was given pre-op instructions over the phone. The opportunity was given for the patient to ask questions. No further questions asked. Patient verbalized understanding of instructions given.   PCP - Garnette Fry,MD Cardiologist - denies  PPM/ICD - denies Device Orders -  Rep Notified -   Chest x-ray - na EKG - 02/09/24 Stress Test - denies ECHO - denies Cardiac Cath - denies  Sleep Study - 04/10/12 CPAP - no  Fasting Blood Sugar - na Checks Blood Sugar _____ times a day  Blood Thinner Instructions:na Aspirin Instructions:na  ERAS Protcol - clears until 1030 PRE-SURGERY Ensure or G2- no  COVID TEST- na   Anesthesia review: no  Patient denies shortness of breath, fever, cough and chest pain over the phone call   Special instructions:    Oral Hygiene is also important to reduce your risk of infection.  Remember - BRUSH YOUR TEETH THE MORNING OF SURGERY WITH YOUR REGULAR TOOTHPASTE

## 2024-06-23 ENCOUNTER — Encounter: Payer: Self-pay | Admitting: Family

## 2024-06-23 ENCOUNTER — Ambulatory Visit (HOSPITAL_BASED_OUTPATIENT_CLINIC_OR_DEPARTMENT_OTHER): Payer: Self-pay | Admitting: Anesthesiology

## 2024-06-23 ENCOUNTER — Ambulatory Visit (HOSPITAL_COMMUNITY)
Admission: RE | Admit: 2024-06-23 | Discharge: 2024-06-23 | Disposition: A | Discharge status: Home / Self Care | Attending: Orthopedic Surgery | Admitting: Orthopedic Surgery

## 2024-06-23 ENCOUNTER — Encounter (HOSPITAL_COMMUNITY): Payer: Self-pay | Admitting: Anesthesiology

## 2024-06-23 ENCOUNTER — Encounter (HOSPITAL_COMMUNITY)
Admission: RE | Disposition: A | Discharge status: Home / Self Care | Payer: Self-pay | Source: Home / Self Care | Attending: Orthopedic Surgery

## 2024-06-23 ENCOUNTER — Encounter (HOSPITAL_COMMUNITY): Payer: Self-pay | Admitting: Orthopedic Surgery

## 2024-06-23 DIAGNOSIS — L0390 Cellulitis, unspecified: Secondary | ICD-10-CM | POA: Insufficient documentation

## 2024-06-23 DIAGNOSIS — F17210 Nicotine dependence, cigarettes, uncomplicated: Secondary | ICD-10-CM

## 2024-06-23 DIAGNOSIS — B957 Other staphylococcus as the cause of diseases classified elsewhere: Secondary | ICD-10-CM | POA: Diagnosis not present

## 2024-06-23 DIAGNOSIS — I10 Essential (primary) hypertension: Secondary | ICD-10-CM

## 2024-06-23 DIAGNOSIS — G4730 Sleep apnea, unspecified: Secondary | ICD-10-CM | POA: Diagnosis not present

## 2024-06-23 DIAGNOSIS — M7021 Olecranon bursitis, right elbow: Secondary | ICD-10-CM

## 2024-06-23 DIAGNOSIS — M71121 Other infective bursitis, right elbow: Secondary | ICD-10-CM | POA: Diagnosis not present

## 2024-06-23 DIAGNOSIS — F419 Anxiety disorder, unspecified: Secondary | ICD-10-CM | POA: Insufficient documentation

## 2024-06-23 DIAGNOSIS — K219 Gastro-esophageal reflux disease without esophagitis: Secondary | ICD-10-CM | POA: Insufficient documentation

## 2024-06-23 DIAGNOSIS — F32A Depression, unspecified: Secondary | ICD-10-CM | POA: Diagnosis not present

## 2024-06-23 HISTORY — PX: OLECRANON BURSECTOMY: SHX2097

## 2024-06-23 HISTORY — DX: Essential (primary) hypertension: I10

## 2024-06-23 LAB — CBC WITH DIFFERENTIAL/PLATELET
Abs Immature Granulocytes: 0.06 K/uL (ref 0.00–0.07)
Basophils Absolute: 0 K/uL (ref 0.0–0.1)
Basophils Relative: 0 %
Eosinophils Absolute: 0.1 K/uL (ref 0.0–0.5)
Eosinophils Relative: 1 %
HCT: 39.9 % (ref 39.0–52.0)
Hemoglobin: 13 g/dL (ref 13.0–17.0)
Immature Granulocytes: 1 %
Lymphocytes Relative: 17 %
Lymphs Abs: 1.9 K/uL (ref 0.7–4.0)
MCH: 31.2 pg (ref 26.0–34.0)
MCHC: 32.6 g/dL (ref 30.0–36.0)
MCV: 95.7 fL (ref 80.0–100.0)
Monocytes Absolute: 1 K/uL (ref 0.1–1.0)
Monocytes Relative: 9 %
Neutro Abs: 8 K/uL — ABNORMAL HIGH (ref 1.7–7.7)
Neutrophils Relative %: 72 %
Platelets: 307 K/uL (ref 150–400)
RBC: 4.17 MIL/uL — ABNORMAL LOW (ref 4.22–5.81)
RDW: 12.4 % (ref 11.5–15.5)
WBC: 10.9 K/uL — ABNORMAL HIGH (ref 4.0–10.5)
nRBC: 0 % (ref 0.0–0.2)

## 2024-06-23 LAB — COMPREHENSIVE METABOLIC PANEL WITH GFR
ALT: 18 U/L (ref 0–44)
AST: 21 U/L (ref 15–41)
Albumin: 2.8 g/dL — ABNORMAL LOW (ref 3.5–5.0)
Alkaline Phosphatase: 56 U/L (ref 38–126)
Anion gap: 12 (ref 5–15)
BUN: 14 mg/dL (ref 8–23)
CO2: 25 mmol/L (ref 22–32)
Calcium: 8.6 mg/dL — ABNORMAL LOW (ref 8.9–10.3)
Chloride: 95 mmol/L — ABNORMAL LOW (ref 98–111)
Creatinine, Ser: 0.93 mg/dL (ref 0.61–1.24)
GFR, Estimated: >60 mL/min (ref >60)
Glucose, Bld: 99 mg/dL (ref 70–99)
Potassium: 4.4 mmol/L (ref 3.5–5.1)
Sodium: 132 mmol/L — ABNORMAL LOW (ref 135–145)
Total Bilirubin: 0.9 mg/dL (ref 0.0–1.2)
Total Protein: 6.5 g/dL (ref 6.5–8.1)

## 2024-06-23 SURGERY — BURSECTOMY, ELBOW
Anesthesia: General | Site: Elbow | Laterality: Right

## 2024-06-23 MED ORDER — PROPOFOL 10 MG/ML IV BOLUS
INTRAVENOUS | Status: AC
Start: 2024-06-23 — End: 2024-06-23
  Filled 2024-06-23: qty 20

## 2024-06-23 MED ORDER — MIDAZOLAM HCL 2 MG/2ML IJ SOLN
INTRAMUSCULAR | Status: AC
  Filled 2024-06-23: qty 2

## 2024-06-23 MED ORDER — MIDAZOLAM HCL (PF) 2 MG/2ML IJ SOLN
INTRAMUSCULAR | Status: DC | PRN
  Administered 2024-06-23: 2 mg via INTRAVENOUS

## 2024-06-23 MED ORDER — LIDOCAINE 2% (20 MG/ML) 5 ML SYRINGE
INTRAMUSCULAR | Status: AC
Start: 2024-06-23 — End: 2024-06-23
  Filled 2024-06-23: qty 5

## 2024-06-23 MED ORDER — TOBRAMYCIN SULFATE 1.2 G IJ SOLR
INTRAMUSCULAR | Status: DC | PRN
  Administered 2024-06-23: 1.2 g via TOPICAL

## 2024-06-23 MED ORDER — FENTANYL CITRATE (PF) 100 MCG/2ML IJ SOLN
INTRAMUSCULAR | Status: AC
  Filled 2024-06-23: qty 2

## 2024-06-23 MED ORDER — CEFAZOLIN SODIUM-DEXTROSE 2-4 GM/100ML-% IV SOLN
2.0000 g | INTRAVENOUS | Status: AC
  Administered 2024-06-23: 2 g via INTRAVENOUS
  Filled 2024-06-23: qty 100

## 2024-06-23 MED ORDER — PHENYLEPHRINE 80 MCG/ML (10ML) SYRINGE FOR IV PUSH (FOR BLOOD PRESSURE SUPPORT)
PREFILLED_SYRINGE | INTRAVENOUS | Status: DC | PRN
  Administered 2024-06-23 (×5): 80 ug via INTRAVENOUS
  Administered 2024-06-23: 120 ug via INTRAVENOUS

## 2024-06-23 MED ORDER — LIDOCAINE 2% (20 MG/ML) 5 ML SYRINGE
INTRAMUSCULAR | Status: DC | PRN
  Administered 2024-06-23: 100 mg via INTRAVENOUS

## 2024-06-23 MED ORDER — MEPERIDINE HCL 25 MG/ML IJ SOLN: 6.2500 mg | INTRAMUSCULAR | Status: DC | PRN

## 2024-06-23 MED ORDER — FENTANYL CITRATE (PF) 250 MCG/5ML IJ SOLN
INTRAMUSCULAR | Status: DC | PRN
  Administered 2024-06-23 (×2): 50 ug via INTRAVENOUS

## 2024-06-23 MED ORDER — VASHE WOUND IRRIGATION OPTIME
TOPICAL | Status: DC | PRN
  Administered 2024-06-23: 34 [oz_av]

## 2024-06-23 MED ORDER — CHLORHEXIDINE GLUCONATE 0.12 % MT SOLN: 15.0000 mL | OROMUCOSAL | Status: AC

## 2024-06-23 MED ORDER — VANCOMYCIN HCL 1000 MG IV SOLR
INTRAVENOUS | Status: DC | PRN
  Administered 2024-06-23: 1000 mg via TOPICAL

## 2024-06-23 MED ORDER — PROPOFOL 10 MG/ML IV BOLUS
INTRAVENOUS | Status: DC | PRN
Start: 2024-06-23 — End: 2024-06-23
  Administered 2024-06-23: 150 mg via INTRAVENOUS

## 2024-06-23 MED ORDER — TOBRAMYCIN SULFATE 1.2 G IJ SOLR
INTRAMUSCULAR | Status: AC
Start: 2024-06-23 — End: 2024-06-23
  Filled 2024-06-23: qty 1.2

## 2024-06-23 MED ORDER — ONDANSETRON HCL 4 MG/2ML IJ SOLN
INTRAMUSCULAR | Status: DC | PRN
  Administered 2024-06-23: 4 mg via INTRAVENOUS

## 2024-06-23 MED ORDER — ONDANSETRON HCL 4 MG/2ML IJ SOLN
INTRAMUSCULAR | Status: AC
  Filled 2024-06-23: qty 2

## 2024-06-23 MED ORDER — CHLORHEXIDINE GLUCONATE 0.12 % MT SOLN
OROMUCOSAL | Status: AC
  Administered 2024-06-23: 15 mL via OROMUCOSAL
  Filled 2024-06-23: qty 15

## 2024-06-23 MED ORDER — OXYCODONE HCL 5 MG/5ML PO SOLN: 5.0000 mg | Freq: Once | ORAL | Status: DC | PRN

## 2024-06-23 MED ORDER — LACTATED RINGERS IV SOLN: INTRAVENOUS | Status: DC

## 2024-06-23 MED ORDER — ONDANSETRON HCL 4 MG/2ML IJ SOLN
4.0000 mg | Freq: Once | INTRAMUSCULAR | Status: DC | PRN
Start: 2024-06-23 — End: 2024-06-23

## 2024-06-23 MED ORDER — DEXAMETHASONE SOD PHOSPHATE PF 10 MG/ML IJ SOLN
INTRAMUSCULAR | Status: DC | PRN
  Administered 2024-06-23: 10 mg via INTRAVENOUS

## 2024-06-23 MED ORDER — VANCOMYCIN HCL 1000 MG IV SOLR
INTRAVENOUS | Status: AC
  Filled 2024-06-23: qty 20

## 2024-06-23 MED ORDER — OXYCODONE HCL 5 MG PO TABS: 5.0000 mg | ORAL_TABLET | Freq: Once | ORAL | Status: DC | PRN

## 2024-06-23 MED ORDER — FENTANYL CITRATE (PF) 100 MCG/2ML IJ SOLN
25.0000 ug | INTRAMUSCULAR | Status: DC | PRN
  Administered 2024-06-23 (×2): 50 ug via INTRAVENOUS

## 2024-06-23 SURGICAL SUPPLY — 38 items
BAG COUNTER SPONGE SURGICOUNT (BAG) IMPLANT
BLADE SURG 21 STRL SS (BLADE) ×1 IMPLANT
BNDG COHESIVE 4X5 TAN STRL LF (GAUZE/BANDAGES/DRESSINGS) IMPLANT
BNDG COHESIVE 6X5 TAN NS LF (GAUZE/BANDAGES/DRESSINGS) IMPLANT
BNDG COHESIVE 6X5 TAN ST LF (GAUZE/BANDAGES/DRESSINGS) IMPLANT
BNDG GAUZE DERMACEA FLUFF 4 (GAUZE/BANDAGES/DRESSINGS) IMPLANT
CANISTER WOUNDNEG PRESSURE 500 (CANNISTER) IMPLANT
CLEANSER WND VASHE 34 (WOUND CARE) IMPLANT
CLEANSER WND VASHE INSTL 34OZ (WOUND CARE) IMPLANT
COVER SURGICAL LIGHT HANDLE (MISCELLANEOUS) ×2 IMPLANT
DRAPE DERMATAC (DRAPES) IMPLANT
DRAPE U-SHAPE 47X51 STRL (DRAPES) ×1 IMPLANT
DRESSING PREVENA PLUS CUSTOM (GAUZE/BANDAGES/DRESSINGS) IMPLANT
DRESSING VERAFLO CLEANS CC MED (GAUZE/BANDAGES/DRESSINGS) IMPLANT
DRSG ADAPTIC 3X8 NADH LF (GAUZE/BANDAGES/DRESSINGS) ×1 IMPLANT
DURAPREP 26ML APPLICATOR (WOUND CARE) ×1 IMPLANT
ELECTRODE REM PT RTRN 9FT ADLT (ELECTROSURGICAL) IMPLANT
GAUZE PAD ABD 8X10 STRL (GAUZE/BANDAGES/DRESSINGS) IMPLANT
GAUZE SPONGE 4X4 12PLY STRL (GAUZE/BANDAGES/DRESSINGS) IMPLANT
GLOVE BIOGEL PI IND STRL 9 (GLOVE) ×1 IMPLANT
GLOVE SURG ORTHO 9.0 STRL STRW (GLOVE) ×1 IMPLANT
GOWN STRL REUS W/ TWL XL LVL3 (GOWN DISPOSABLE) ×2 IMPLANT
GRAFT SKIN WND MARIGEN OMEGA3 (Tissue) IMPLANT
KIT BASIN OR (CUSTOM PROCEDURE TRAY) ×1 IMPLANT
KIT TURNOVER KIT B (KITS) ×1 IMPLANT
MANIFOLD NEPTUNE II (INSTRUMENTS) ×1 IMPLANT
PACK ORTHO EXTREMITY (CUSTOM PROCEDURE TRAY) ×1 IMPLANT
PAD ARMBOARD POSITIONER FOAM (MISCELLANEOUS) ×2 IMPLANT
PAD NEG PRESSURE SENSATRAC (MISCELLANEOUS) IMPLANT
SET HNDPC FAN SPRY TIP SCT (DISPOSABLE) IMPLANT
SOLN 0.9% NACL POUR BTL 1000ML (IV SOLUTION) ×1 IMPLANT
STOCKINETTE IMPERVIOUS 9X36 MD (GAUZE/BANDAGES/DRESSINGS) IMPLANT
SUT ETHILON 2 0 PSLX (SUTURE) ×1 IMPLANT
SWAB COLLECTION DEVICE MRSA (MISCELLANEOUS) ×1 IMPLANT
SWAB CULTURE ESWAB REG 1ML (MISCELLANEOUS) IMPLANT
TOWEL GREEN STERILE (TOWEL DISPOSABLE) ×1 IMPLANT
TUBE CONNECTING 12X1/4 (SUCTIONS) ×1 IMPLANT
YANKAUER SUCT BULB TIP NO VENT (SUCTIONS) ×1 IMPLANT

## 2024-06-23 NOTE — Interval H&P Note (Signed)
 History and Physical Interval Note:  06/23/2024 11:41 AM  Dwayne Woodard  has presented today for surgery, with the diagnosis of Olecranon Bursitis Right Elbow.  The various methods of treatment have been discussed with the patient and family. After consideration of risks, benefits and other options for treatment, the patient has consented to  Procedure(s) with comments: BURSECTOMY, ELBOW (Right) - Right Olecranon Bursectomy as a surgical intervention.  The patient's history has been reviewed, patient examined, no change in status, stable for surgery.  I have reviewed the patient's chart and labs.  Questions were answered to the patient's satisfaction.     Dwayne Woodard

## 2024-06-23 NOTE — Op Note (Signed)
 06/23/2024  2:16 PM  PATIENT:  Dwayne Woodard    PRE-OPERATIVE DIAGNOSIS:  Olecranon Bursitis Right Elbow  POST-OPERATIVE DIAGNOSIS:  Same  PROCEDURE:  BURSECTOMY, ELBOW  SURGEON:  Jerona LULLA Sage, MD  PHYSICIAN ASSISTANT:None ANESTHESIA:   General  PREOPERATIVE INDICATIONS:  [name] is a  68 y.o. male with a diagnosis of Olecranon Bursitis Right Elbow who failed conservative measures and elected for surgical management.    The risks benefits and alternatives were discussed with the patient preoperatively including but not limited to the risks of infection, bleeding, nerve injury, cardiopulmonary complications, the need for revision surgery, among others, and the patient was willing to proceed.  OPERATIVE IMPLANTS:   Implant Name Type Inv. Item Serial No. Manufacturer Lot No. LRB No. Used Action  GRAFT SKIN WND MARIGEN OMEGA3 - [id number] Tissue GRAFT SKIN WND MARIGEN OMEGA3  [employer] [id number] Right 1 Implanted    @ENCIMAGES @  OPERATIVE FINDINGS: Chronic inflammatory bursa excised.  Tissue was sent for cultures.  The wound was filled with vancomycin, tobramycin, Kerecis tissue graft.  OPERATIVE PROCEDURE: Patient was brought the operating room underwent a general anesthetic.  After adequate levels anesthesia obtained patient's right upper extremity was prepped using DuraPrep draped into a sterile field a timeout was called.  A posterior incision was made over the olecranon the olecranon bursa was excised in 1 block of tissue.  Electrocautery was used for hemostasis.  The wound was irrigated with Vashe.  The wound bed was then filled with a mixture of 19 cm of Kerecis, 1 g vancomycin powder, 1.2 g of tobramycin powder.  The incision was closed using 2-0 nylon a sterile compression dressing was applied patient was extubated taken the PACU in stable condition   DISCHARGE PLANNING:  Antibiotic duration: Preoperative antibiotics  Weightbearing: Not applicable  Pain  medication: Patient has a prescription of Percocet at home  Dressing care/ Wound VAC: Keep dressing dry and intact until follow-up  Ambulatory devices: Not applicable  Discharge to: Home.  Follow-up: In the office 1 week post operative.

## 2024-06-23 NOTE — Anesthesia Postprocedure Evaluation (Signed)
 Anesthesia Post Note  Patient: Dwayne Woodard  Procedure(s) Performed: BURSECTOMY, ELBOW (Right: Elbow)     Patient location during evaluation: PACU Anesthesia Type: General Level of consciousness: awake and alert Pain management: pain level controlled Vital Signs Assessment: post-procedure vital signs reviewed and stable Respiratory status: spontaneous breathing, nonlabored ventilation, respiratory function stable and patient connected to nasal cannula oxygen Cardiovascular status: blood pressure returned to baseline and stable Postop Assessment: no apparent nausea or vomiting Anesthetic complications: no   No notable events documented.  Last Vitals:  Vitals:   06/23/24 1136 06/23/24 1421  BP: 133/83 (!) 150/88  Pulse: 98   Resp: 18 (!) 23  Temp: 36.7 C 36.4 C  SpO2: 95% 98%    Last Pain:  Vitals:   06/23/24 1421  TempSrc:   PainSc: 4                  [name]

## 2024-06-23 NOTE — Anesthesia Preprocedure Evaluation (Addendum)
 Anesthesia Evaluation  Patient identified by MRN, date of birth, ID band Patient awake    Reviewed: Allergy & Precautions, H&P , NPO status , Patient's Chart, lab work & pertinent test results  Airway Mallampati: II  TM Distance: >3 FB Neck ROM: Full    Dental no notable dental hx.    Pulmonary neg pulmonary ROS, sleep apnea , Current Smoker   Pulmonary exam normal breath sounds clear to auscultation       Cardiovascular Exercise Tolerance: Good hypertension, Pt. on medications and Pt. on home beta blockers negative cardio ROS Normal cardiovascular exam Rhythm:Regular Rate:Normal     Neuro/Psych  PSYCHIATRIC DISORDERS Anxiety Depression    negative neurological ROS  negative psych ROS   GI/Hepatic negative GI ROS, Neg liver ROS,GERD  Medicated,,  Endo/Other  negative endocrine ROS    Renal/GU negative Renal ROS  negative genitourinary   Musculoskeletal negative musculoskeletal ROS (+)    Abdominal   Peds negative pediatric ROS (+)  Hematology negative hematology ROS (+)   Anesthesia Other Findings   Reproductive/Obstetrics negative OB ROS                              Anesthesia Physical Anesthesia Plan  ASA: 3  Anesthesia Plan: General   Post-op Pain Management: Tylenol  PO (pre-op)* and Celebrex PO (pre-op)*   Induction: Intravenous  PONV Risk Score and Plan: 2 and Ondansetron  and Dexamethasone   Airway Management Planned: LMA  Additional Equipment: None  Intra-op Plan:   Post-operative Plan:   Informed Consent: I have reviewed the patients History and Physical, chart, labs and discussed the procedure including the risks, benefits and alternatives for the proposed anesthesia with the patient or authorized representative who has indicated his/her understanding and acceptance.       Plan Discussed with: CRNA, Surgeon and Anesthesiologist  Anesthesia Plan Comments: ( )          Anesthesia Quick Evaluation

## 2024-06-23 NOTE — Discharge Instructions (Signed)
 Keep elbow straight.  Keep dressing clean and dry.  F/U in 1 week.  You can lay down and elevate your arm on a few pillows to decrease pain and swelling.

## 2024-06-23 NOTE — Transfer of Care (Signed)
 Immediate Anesthesia Transfer of Care Note  Patient: Dwayne Woodard  Procedure(s) Performed: BURSECTOMY, ELBOW (Right: Elbow)  Patient Location: PACU  Anesthesia Type:General  Level of Consciousness: awake, alert , oriented, patient cooperative, and responds to stimulation  Airway & Oxygen Therapy: Patient Spontanous Breathing and Patient connected to face mask oxygen  Post-op Assessment: Report given to RN and Post -op Vital signs reviewed and stable  Post vital signs: Reviewed and stable  Last Vitals:  Vitals Value Taken Time  BP    Temp    Pulse    Resp    SpO2      Last Pain:  Vitals:   06/23/24 1206  TempSrc:   PainSc: 0-No pain      Patients Stated Pain Goal: 0 (06/23/24 1206)  Complications: No notable events documented.

## 2024-06-23 NOTE — Anesthesia Procedure Notes (Signed)
 Procedure Name: LMA Insertion Date/Time: 06/23/2024 1:36 PM  Performed by: Jolynn Mage, CRNAPre-anesthesia Checklist: Patient identified, Emergency Drugs available, Suction available and Patient being monitored Patient Re-evaluated:Patient Re-evaluated prior to induction Oxygen Delivery Method: Circle system utilized Preoxygenation: Pre-oxygenation with 100% oxygen Induction Type: IV induction Ventilation: Mask ventilation without difficulty LMA: LMA flexible inserted LMA Size: 5.0 Number of attempts: 1 Placement Confirmation: positive ETCO2 and breath sounds checked- equal and bilateral Tube secured with: Tape Dental Injury: Teeth and Oropharynx as per pre-operative assessment

## 2024-06-24 ENCOUNTER — Encounter (HOSPITAL_COMMUNITY): Payer: Self-pay | Admitting: Orthopedic Surgery

## 2024-06-28 LAB — AEROBIC/ANAEROBIC CULTURE W GRAM STAIN (SURGICAL/DEEP WOUND)

## 2024-06-30 ENCOUNTER — Ambulatory Visit: Admitting: Family

## 2024-06-30 ENCOUNTER — Encounter: Payer: Self-pay | Admitting: Family

## 2024-06-30 DIAGNOSIS — M7021 Olecranon bursitis, right elbow: Secondary | ICD-10-CM
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
## 2024-07-05 ENCOUNTER — Other Ambulatory Visit: Payer: Self-pay | Admitting: Family Medicine

## 2024-07-07 ENCOUNTER — Encounter: Payer: Self-pay | Admitting: Family

## 2024-07-07 ENCOUNTER — Ambulatory Visit: Admitting: Family

## 2024-07-07 DIAGNOSIS — M7021 Olecranon bursitis, right elbow: Secondary | ICD-10-CM

## 2024-07-07 MED ORDER — DOXYCYCLINE HYCLATE 100 MG PO TABS: 100.0000 mg | ORAL_TABLET | Freq: Two times a day (BID) | ORAL | 0 refills | Status: DC

## 2024-07-07 MED ORDER — DOXYCYCLINE HYCLATE 100 MG PO TABS: 100.0000 mg | ORAL_TABLET | Freq: Two times a day (BID) | ORAL | 0 refills | Status: AC
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
## 2024-07-12 ENCOUNTER — Telehealth: Payer: Self-pay | Admitting: Family Medicine

## 2024-07-12 NOTE — Telephone Encounter (Unsigned)
 Copied from CRM #[id number]. Topic: Clinical - Medication Refill >> Jul 12, 2024  5:19 PM Alfonso HERO wrote: Medication: ALPRAZolam  (XANAX ) 1 MG tablet  Has the patient contacted their pharmacy? Yes (Agent: If no, request that the patient contact the pharmacy for the refill. If patient does not wish to contact the pharmacy document the reason why and proceed with request.) (Agent: If yes, when and what did the pharmacy advise?)  This is the patient's preferred pharmacy:  [id number] - [place], [place] - [address] [place] [address] Phone: [phone] Fax: [phone]  Is this the correct pharmacy for this prescription? Yes If no, delete pharmacy and type the correct one.   Has the prescription been filled recently? Yes  Is the patient out of the medication? Yes  Has the patient been seen for an appointment in the last year OR does the patient have an upcoming appointment? Yes  Can we respond through MyChart? Yes  Agent: Please be advised that Rx refills may take up to 3 business days. We ask that you follow-up with your pharmacy.

## 2024-07-13 ENCOUNTER — Other Ambulatory Visit (HOSPITAL_COMMUNITY): Payer: Self-pay

## 2024-07-13 ENCOUNTER — Telehealth: Payer: Self-pay

## 2024-07-13 MED ORDER — ALPRAZOLAM 1 MG PO TABS: 1.0000 mg | ORAL_TABLET | Freq: Three times a day (TID) | ORAL | 1 refills | Status: AC | PRN

## 2024-07-13 NOTE — Telephone Encounter (Signed)
 Done

## 2024-07-13 NOTE — Telephone Encounter (Signed)
 Pharmacy Patient Advocate Encounter   Received notification from Onbase that prior authorization for Alprazolam  1 is required/requested.    Unable to verify insurance. Patients [employer] expired 06/21/24. New patient card only shows back and eligibility checks only picks up expired information.   Please advise

## 2024-07-13 NOTE — Telephone Encounter (Signed)
 Attempted to contact pt for the new Insurance card not successful speaking with pt will try call him back later

## 2024-07-13 NOTE — Telephone Encounter (Signed)
 Spoke with pt advised to come by the office and bring his copy of his insurances card so we can resubmit his PA for approval

## 2024-07-14 ENCOUNTER — Encounter: Admitting: Family

## 2024-07-14 ENCOUNTER — Ambulatory Visit (INDEPENDENT_AMBULATORY_CARE_PROVIDER_SITE_OTHER): Admitting: Family

## 2024-07-14 ENCOUNTER — Encounter: Payer: Self-pay | Admitting: Family

## 2024-07-14 DIAGNOSIS — M7021 Olecranon bursitis, right elbow: Secondary | ICD-10-CM
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
## 2024-07-19 ENCOUNTER — Telehealth: Payer: Self-pay | Admitting: Orthopedic Surgery

## 2024-07-19 NOTE — Telephone Encounter (Signed)
 Pt called stating he had elbow surgery and elbow is pussing and fluid. Pt states he can't come today but can come tomorrow. Explained to pt he should be seen today but pt declined and stated to have another dr appt.Offer pt 12/2 @ 2:15pm.

## 2024-07-19 NOTE — Telephone Encounter (Signed)
 Pt was advised he should come in for eval today and he declined and instead agreed to come in tomorrow at 2.

## 2024-07-20 ENCOUNTER — Ambulatory Visit: Admitting: Orthopedic Surgery

## 2024-07-20 DIAGNOSIS — M71121 Other infective bursitis, right elbow: Secondary | ICD-10-CM

## 2024-07-20 DIAGNOSIS — M7021 Olecranon bursitis, right elbow: Secondary | ICD-10-CM

## 2024-07-20 MED ORDER — MUPIROCIN 2 % EX OINT: 1.0000 | TOPICAL_OINTMENT | Freq: Two times a day (BID) | CUTANEOUS | 3 refills | Status: AC

## 2024-07-21 ENCOUNTER — Encounter: Payer: Self-pay | Admitting: Orthopedic Surgery
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
## 2024-07-27 ENCOUNTER — Telehealth: Payer: Self-pay | Admitting: *Deleted

## 2024-07-27 NOTE — Telephone Encounter (Signed)
 Copied from CRM [id number]. Topic: Clinical - Medication Question >> Jul 27, 2024  8:36 AM Dwayne Woodard wrote: Reason for CRM: [facility]-is requesting a new script for the following medication.   metoprolol  succinate (TOPROL -XL) 25 MG 24 hr tablet predniSONE  (DELTASONE ) 10 MG tablet  [phone] [phone] [address] [facility] [address]

## 2024-07-28 MED ORDER — METOPROLOL SUCCINATE ER 25 MG PO TB24: 25.0000 mg | ORAL_TABLET | Freq: Every day | ORAL | 1 refills | Status: AC

## 2024-07-28 NOTE — Telephone Encounter (Signed)
 Refill of metoprolol  sent.  Okay to send in refill of prednisone ?

## 2024-08-03 ENCOUNTER — Telehealth: Payer: Self-pay | Admitting: *Deleted

## 2024-08-03 ENCOUNTER — Ambulatory Visit: Admitting: Orthopedic Surgery

## 2024-08-03 NOTE — Telephone Encounter (Signed)
 Pt refill for prednisone  sent to pt pharmacy by Dr Johnny

## 2024-08-03 NOTE — Telephone Encounter (Signed)
 Spoke with pt this morning regarding this message pt states that he was prescribed Hydrocodone  from a fall he had a week ago and he was told not to take his Xanax  while taking the Hydrocodone . Pt state that he resumed taking his xanax  after completing the Hydrocodone  dose. Pt states that everything is good.

## 2024-08-03 NOTE — Telephone Encounter (Signed)
 Copied from CRM [id number]. Topic: Clinical - Medication Prior Auth >> Jul 13, 2024 12:01 PM Alfonso ORN wrote: Reason for CRM: Pt called back to return call. Relayed message regarding insurance verification. Pt stated that the [facility] card says good to use until 08/2024 and provided member/provider service contact number [phone]. Please contact pt to clarify >> Jul 13, 2024  4:51 PM Viola F wrote: [facility] called to let Dr. Johnny know that there is a drug interaction with the ALPRAZolam  (XANAX ) 1 MG tablet that is causing for it not be filled. Please call patient with update on what medication this is interacting with and what he should do moving forward. [phone]

## 2024-08-04 ENCOUNTER — Encounter: Payer: Self-pay | Admitting: Orthopedic Surgery
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
## 2024-08-17 ENCOUNTER — Encounter: Admitting: Orthopedic Surgery

## 2024-08-26 ENCOUNTER — Ambulatory Visit (INDEPENDENT_AMBULATORY_CARE_PROVIDER_SITE_OTHER): Admitting: Physician Assistant

## 2024-08-26 ENCOUNTER — Encounter: Payer: Self-pay | Admitting: Physician Assistant

## 2024-08-26 DIAGNOSIS — T8130XA Disruption of wound, unspecified, initial encounter: Secondary | ICD-10-CM

## 2024-08-26 DIAGNOSIS — M7021 Olecranon bursitis, right elbow: Secondary | ICD-10-CM
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
## 2024-09-06 ENCOUNTER — Telehealth: Payer: Self-pay | Admitting: Orthopedic Surgery

## 2024-09-06 NOTE — Telephone Encounter (Signed)
 CALLED PT LEFT VM PROVIDER WILL NOT BE IN OFFICE 1/29 NOR 2/2. PT NEED TO R/S/

## 2024-09-16 ENCOUNTER — Encounter: Admitting: Orthopedic Surgery

## 2025-07-25 ENCOUNTER — Ambulatory Visit
